# Patient Record
Sex: Female | Born: 1960 | Race: Black or African American | Hispanic: No | Smoking: Never smoker
Health system: Southern US, Community
[De-identification: ages and names within clinical notes are randomized; demographics above are authoritative.]

## PROBLEM LIST (undated history)

## (undated) DIAGNOSIS — E559 Vitamin D deficiency, unspecified: Secondary | ICD-10-CM

## (undated) DIAGNOSIS — D649 Anemia, unspecified: Secondary | ICD-10-CM

## (undated) DIAGNOSIS — K219 Gastro-esophageal reflux disease without esophagitis: Secondary | ICD-10-CM

## (undated) DIAGNOSIS — E119 Type 2 diabetes mellitus without complications: Secondary | ICD-10-CM

## (undated) DIAGNOSIS — I10 Essential (primary) hypertension: Secondary | ICD-10-CM

## (undated) DIAGNOSIS — E785 Hyperlipidemia, unspecified: Secondary | ICD-10-CM

## (undated) HISTORY — DX: Vitamin D deficiency, unspecified: E55.9

## (undated) HISTORY — DX: Gastro-esophageal reflux disease without esophagitis: K21.9

## (undated) HISTORY — DX: Anemia, unspecified: D64.9

## (undated) HISTORY — DX: Hyperlipidemia, unspecified: E78.5

## (undated) HISTORY — DX: Type 2 diabetes mellitus without complications: E11.9

## (undated) HISTORY — DX: Essential (primary) hypertension: I10

---

## 1997-05-12 ENCOUNTER — Other Ambulatory Visit: Admission: RE | Admit: 1997-05-12 | Discharge: 1997-05-12 | Payer: Self-pay | Admitting: *Deleted

## 1998-10-20 ENCOUNTER — Other Ambulatory Visit: Admission: RE | Admit: 1998-10-20 | Discharge: 1998-10-20 | Payer: Self-pay | Admitting: *Deleted

## 1999-01-02 ENCOUNTER — Other Ambulatory Visit: Admission: RE | Admit: 1999-01-02 | Discharge: 1999-01-02 | Payer: Self-pay | Admitting: *Deleted

## 1999-01-25 ENCOUNTER — Encounter (INDEPENDENT_AMBULATORY_CARE_PROVIDER_SITE_OTHER): Payer: Self-pay

## 1999-01-25 ENCOUNTER — Other Ambulatory Visit: Admission: RE | Admit: 1999-01-25 | Discharge: 1999-01-25 | Payer: Self-pay | Admitting: *Deleted

## 1999-10-17 ENCOUNTER — Other Ambulatory Visit: Admission: RE | Admit: 1999-10-17 | Discharge: 1999-10-17 | Payer: Self-pay | Admitting: *Deleted

## 2002-03-19 ENCOUNTER — Other Ambulatory Visit: Admission: RE | Admit: 2002-03-19 | Discharge: 2002-03-19 | Payer: Self-pay | Admitting: *Deleted

## 2003-03-25 ENCOUNTER — Other Ambulatory Visit: Admission: RE | Admit: 2003-03-25 | Discharge: 2003-03-25 | Payer: Self-pay | Admitting: Obstetrics and Gynecology

## 2004-04-06 ENCOUNTER — Other Ambulatory Visit: Admission: RE | Admit: 2004-04-06 | Discharge: 2004-04-06 | Payer: Self-pay | Admitting: Obstetrics and Gynecology

## 2005-05-29 ENCOUNTER — Other Ambulatory Visit: Admission: RE | Admit: 2005-05-29 | Discharge: 2005-05-29 | Payer: Self-pay | Admitting: Obstetrics and Gynecology

## 2007-02-13 HISTORY — PX: HAMMER TOE SURGERY: SHX385

## 2009-08-11 ENCOUNTER — Ambulatory Visit (HOSPITAL_COMMUNITY): Admission: RE | Admit: 2009-08-11 | Discharge: 2009-08-11 | Payer: Self-pay | Admitting: Internal Medicine

## 2010-08-21 ENCOUNTER — Other Ambulatory Visit (HOSPITAL_COMMUNITY): Payer: Self-pay | Admitting: Obstetrics and Gynecology

## 2010-08-21 DIAGNOSIS — Z1231 Encounter for screening mammogram for malignant neoplasm of breast: Secondary | ICD-10-CM

## 2010-08-31 ENCOUNTER — Ambulatory Visit (HOSPITAL_COMMUNITY): Payer: 59

## 2010-09-12 ENCOUNTER — Ambulatory Visit (HOSPITAL_COMMUNITY)
Admission: RE | Admit: 2010-09-12 | Discharge: 2010-09-12 | Disposition: A | Discharge status: Home / Self Care | Payer: 59 | Source: Ambulatory Visit | Attending: Obstetrics and Gynecology | Admitting: Obstetrics and Gynecology

## 2010-09-12 DIAGNOSIS — Z1231 Encounter for screening mammogram for malignant neoplasm of breast: Secondary | ICD-10-CM

## 2011-08-31 ENCOUNTER — Other Ambulatory Visit (HOSPITAL_COMMUNITY): Payer: Self-pay | Admitting: Obstetrics and Gynecology

## 2011-08-31 DIAGNOSIS — Z1231 Encounter for screening mammogram for malignant neoplasm of breast: Secondary | ICD-10-CM

## 2011-09-27 ENCOUNTER — Ambulatory Visit (HOSPITAL_COMMUNITY)
Admission: RE | Admit: 2011-09-27 | Discharge: 2011-09-27 | Disposition: A | Discharge status: Home / Self Care | Payer: No Typology Code available for payment source | Source: Ambulatory Visit | Attending: Obstetrics and Gynecology | Admitting: Obstetrics and Gynecology

## 2011-09-27 DIAGNOSIS — Z1231 Encounter for screening mammogram for malignant neoplasm of breast: Secondary | ICD-10-CM | POA: Insufficient documentation

## 2012-09-10 ENCOUNTER — Other Ambulatory Visit (HOSPITAL_COMMUNITY): Payer: Self-pay | Admitting: Obstetrics and Gynecology

## 2012-09-10 DIAGNOSIS — Z1231 Encounter for screening mammogram for malignant neoplasm of breast: Secondary | ICD-10-CM

## 2012-10-02 ENCOUNTER — Other Ambulatory Visit (HOSPITAL_COMMUNITY): Payer: Self-pay | Admitting: Obstetrics and Gynecology

## 2012-10-02 ENCOUNTER — Ambulatory Visit (HOSPITAL_COMMUNITY)
Admission: RE | Admit: 2012-10-02 | Discharge: 2012-10-02 | Disposition: A | Discharge status: Home / Self Care | Payer: PRIVATE HEALTH INSURANCE | Source: Ambulatory Visit | Attending: Obstetrics and Gynecology | Admitting: Obstetrics and Gynecology

## 2012-10-02 DIAGNOSIS — Z1231 Encounter for screening mammogram for malignant neoplasm of breast: Secondary | ICD-10-CM | POA: Insufficient documentation

## 2013-01-02 ENCOUNTER — Ambulatory Visit: Payer: Self-pay | Admitting: Emergency Medicine

## 2013-01-10 ENCOUNTER — Encounter: Payer: Self-pay | Admitting: Internal Medicine

## 2013-01-10 DIAGNOSIS — I10 Essential (primary) hypertension: Secondary | ICD-10-CM | POA: Insufficient documentation

## 2013-01-10 DIAGNOSIS — E785 Hyperlipidemia, unspecified: Secondary | ICD-10-CM | POA: Insufficient documentation

## 2013-01-10 DIAGNOSIS — D649 Anemia, unspecified: Secondary | ICD-10-CM | POA: Insufficient documentation

## 2013-01-10 DIAGNOSIS — E559 Vitamin D deficiency, unspecified: Secondary | ICD-10-CM | POA: Insufficient documentation

## 2013-01-10 DIAGNOSIS — K219 Gastro-esophageal reflux disease without esophagitis: Secondary | ICD-10-CM | POA: Insufficient documentation

## 2013-01-12 ENCOUNTER — Ambulatory Visit: Payer: Self-pay | Admitting: Emergency Medicine

## 2013-04-02 ENCOUNTER — Other Ambulatory Visit: Payer: Self-pay | Admitting: Internal Medicine

## 2013-04-04 DIAGNOSIS — Z79899 Other long term (current) drug therapy: Secondary | ICD-10-CM | POA: Insufficient documentation

## 2013-04-04 NOTE — Patient Instructions (Signed)

## 2013-04-04 NOTE — Progress Notes (Signed)
Patient ID: Gloria Hudson, female   DOB: 16-Jun-1960, 53 y.o.   MRN: [id number]   Annual Screening Comprehensive Examination  This very nice 53 y.o. SBF presents for complete physical.  Patient has been followed for HTN, Diabetes  Prediabetes, Hyperlipidemia, GERD and Vitamin D Deficiency. She also relate a 3 day Hx/o head and chest congestion, frontal HA, pain over her cheeks and cough.    HTN predates since 2004 . Patient's BP has been controlled at home. Today's BP: 138/82 mmHg. Patient denies any cardiac symptoms as chest pain, palpitations, shortness of breath, dizziness or ankle swelling.   Patient's hyperlipidemia is controlled with diet and medications. Patient denies myalgias or other medication SE's. Last cholesterol last visit was 183, triglycerides 111, HDL 87 and LDL 74 in Sept 4098 - all at goal..     Patient has prediabetes/insulin resistance predating since Feb 2011 with  A1c  6.2% / insulin 97 and last A1c/Insulin was 6.0%/80 in Sept 2014 showing Insulin Resistance. She is overweight with a BMI 27 and has lost about 5 # since Sept 2014 with the help of Phentermine.  Patient denies reactive hypoglycemic symptoms, visual blurring, diabetic polys, or paresthesias.    Patient has GERD which is controlled with diet and meds.   Finally, patient has history of Vitamin D Deficiency of 8 in 2008 with last vitamin D 67 in Sept 2014.   Medication List         aspirin 81 MG tablet  Take 81 mg by mouth daily.     atorvastatin 40 MG tablet  Commonly known as:  LIPITOR  Take 40 mg by mouth every other day.     azithromycin 250 MG tablet  Commonly known as:  ZITHROMAX  Take 2 tablets (500 mg) on  Day 1,  followed by 1 tablet (250 mg) once daily on Days 2 through 5.     cyclobenzaprine 10 MG tablet  Commonly known as:  FLEXERIL  Take 1/2 to 1 tablet by mouth three times daily as needed for spasm     hydrochlorothiazide 25 MG tablet  Commonly known as:  HYDRODIURIL  Take one  tablet by mouth every morning     HYDROcodone-acetaminophen 5-325 MG per tablet  Commonly known as:  NORCO  1/2 to 1 tablet every 3 - 4 hours as needed for cough or pain     Magnesium 250 MG Tabs  Take by mouth daily.     meloxicam 15 MG tablet  Commonly known as:  MOBIC     multivitamin with minerals tablet  Take 1 tablet by mouth daily.     omeprazole 40 MG capsule  Commonly known as:  PRILOSEC  Take 40 mg by mouth daily.     phentermine 37.5 MG capsule  Take 37.5 mg by mouth daily.     predniSONE 20 MG tablet  Commonly known as:  DELTASONE  Take 1 tablet (20 mg total) by mouth See admin instructions. 1 tab 3 x day for 3 days, then 1 tab 2 x day for 3 days, then 1 tab 1 x day for 5 days     SLOW FE PO  Take by mouth daily.     SUPER B COMPLEX PO  Take by mouth daily.     VELIVET 0.1/0.125/0.15 -0.025 MG tablet  Generic drug:  desogestrel-ethinyl estradiol     Vitamin D 2000 UNITS tablet  Take 2,000 Units by mouth 4 (four) times daily.  Allergies no known allergies  Past Medical History  Diagnosis Date  . Hypertension   . Hyperlipidemia   . GERD (gastroesophageal reflux disease)   . Vitamin D deficiency   . Anemia     Past Surgical History  2009 - Lt 3-4-5 Hammertoe Repair  Family History  Problem Relation Age of Onset  . Hypertension Mother   . Diabetes Maternal Grandmother     History  Substance Use Topics  . Smoking status: Never Smoker   . Smokeless tobacco: Not on file  . Alcohol Use: Yes     Comment: rare    ROS Constitutional: Denies fever, chills, weight loss/gain, headaches, insomnia, fatigue, night sweats, and change in appetite. Eyes: Denies redness, blurred vision, diplopia, discharge, itchy, watery eyes.  ENT: Denies discharge, post nasal drip, epistaxis, sore throat, earache, hearing loss, dental pain, Tinnitus, Vertigo, snoring.  C/o sinus pain & congestion x 3 days. Cardio: Denies chest pain, palpitations, irregular  heartbeat, syncope, dyspnea, diaphoresis, orthopnea, PND, claudication, edema Respiratory: denies dyspnea, DOE, pleurisy, hoarseness, laryngitis, wheezing. Has recent dry cough. Gastrointestinal: Denies dysphagia, heartburn, reflux, water brash, pain, cramps, nausea, vomiting, bloating, diarrhea, constipation, hematemesis, melena, hematochezia, jaundice, hemorrhoids Genitourinary: Denies dysuria, frequency, urgency, nocturia, hesitancy, discharge, hematuria, flank pain. Had Recent PAP by Dr Jarold Song. Breast:Breast lumps, nipple discharge, bleeding.  Musculoskeletal: Denies arthralgia, myalgia, stiffness, Jt. Swelling, pain, limp, and strain/sprain. Skin: Denies puritis, rash, hives, warts, acne, eczema, changing in skin lesion Neuro: No weakness, tremor, incoordination, spasms, paresthesia, pain Psychiatric: Denies confusion, memory loss, sensory loss Endocrine: Denies change in weight, skin, hair change, nocturia, and paresthesia, diabetic polys, visual blurring, hyper / hypo glycemic episodes.  Heme/Lymph: No excessive bleeding, bruising, enlarged lymph nodes.  BP: 138/82  Pulse: 84  Temp: 97.9 F (36.6 C)  Resp: 18   Estimated body mass index is 27.39 kg/(m^2) as calculated from the following:   Height as of this encounter: 5' 5.5" (1.664 m).   Weight as of this encounter: 167 lb 3.2 oz (75.841 kg).  Physical Exam General Appearance: Well nourished, in no apparent distress. Eyes: PERRLA, EOMs, conjunctiva no swelling or erythema, normal fundi and vessels. Sinuses: Bilat. frontal/maxillary tenderness ENT/Mouth: EACs patent / TMs  nl. Nares clear without erythema, swelling, mucoid exudates. Oral hygiene is good. No erythema, swelling, or exudate. Tongue normal, non-obstructing. Tonsils not swollen or erythematous. Hearing normal.  Neck: Supple, thyroid normal. No bruits, nodes or JVD. Respiratory: Respiratory effort normal.  BS equal and clear bilateral without rales, rhonci, wheezing  or stridor. Has dry cough. Cardio: Heart sounds are normal with regular rate and rhythm and no murmurs, rubs or gallops. Peripheral pulses are normal and equal bilaterally without edema. No aortic or femoral bruits. Chest: symmetric with normal excursions and percussion. Breasts: Deferred to Dr Meissinger. Abdomen: Flat, soft, with bowl sounds. Nontender, no guarding, rebound, hernias, masses, or organomegaly.  Lymphatics: Non tender without lymphadenopathy.  Musculoskeletal: Full ROM all peripheral extremities, joint stability, 5/5 strength, and normal gait. Skin: Warm and dry without rashes, lesions, cyanosis, clubbing or  ecchymosis.  Neuro: Cranial nerves intact, reflexes equal bilaterally. Normal muscle tone, no cerebellar symptoms. Sensation intact.  Pysch: Awake and oriented X 3, normal affect, Insight and Judgment appropriate.   Assessment and Plan  1. Annual Screening Examination 2. Hypertension  3. Hyperlipidemia 4. Pre Diabetes 5. Vitamin D Deficiency 6. GERD 7.Sinusitis, Acute  Continue prudent diet as discussed, weight control, BP monitoring, regular exercise, and medications. Discussed med's effects and SE's. Screening  labs and tests as requested with regular follow-up as recommended. Will schedule Screening Colon as she is now 53 y.o.

## 2013-04-06 ENCOUNTER — Encounter: Payer: Self-pay | Admitting: Internal Medicine

## 2013-04-06 ENCOUNTER — Ambulatory Visit (INDEPENDENT_AMBULATORY_CARE_PROVIDER_SITE_OTHER): Payer: PRIVATE HEALTH INSURANCE | Admitting: Internal Medicine

## 2013-04-06 VITALS — BP 138/82 | HR 84 | Temp 97.9°F | Resp 18 | Ht 65.5 in | Wt 167.2 lb

## 2013-04-06 DIAGNOSIS — Z0000 Encounter for general adult medical examination without abnormal findings: Secondary | ICD-10-CM

## 2013-04-06 DIAGNOSIS — E559 Vitamin D deficiency, unspecified: Secondary | ICD-10-CM

## 2013-04-06 DIAGNOSIS — R7402 Elevation of levels of lactic acid dehydrogenase (LDH): Secondary | ICD-10-CM

## 2013-04-06 DIAGNOSIS — Z113 Encounter for screening for infections with a predominantly sexual mode of transmission: Secondary | ICD-10-CM

## 2013-04-06 DIAGNOSIS — I10 Essential (primary) hypertension: Secondary | ICD-10-CM

## 2013-04-06 DIAGNOSIS — R7309 Other abnormal glucose: Secondary | ICD-10-CM

## 2013-04-06 DIAGNOSIS — Z1211 Encounter for screening for malignant neoplasm of colon: Secondary | ICD-10-CM

## 2013-04-06 DIAGNOSIS — Z79899 Other long term (current) drug therapy: Secondary | ICD-10-CM

## 2013-04-06 DIAGNOSIS — R740 Nonspecific elevation of levels of transaminase and lactic acid dehydrogenase [LDH]: Secondary | ICD-10-CM

## 2013-04-06 DIAGNOSIS — Z1212 Encounter for screening for malignant neoplasm of rectum: Secondary | ICD-10-CM

## 2013-04-06 DIAGNOSIS — J019 Acute sinusitis, unspecified: Secondary | ICD-10-CM

## 2013-04-06 LAB — CBC WITH DIFFERENTIAL/PLATELET
BASOS ABS: 0.1 10*3/uL (ref 0.0–0.1)
Basophils Relative: 1 % (ref 0–1)
EOS ABS: 0.1 10*3/uL (ref 0.0–0.7)
Eosinophils Relative: 1 % (ref 0–5)
HCT: 36.3 % (ref 36.0–46.0)
HEMOGLOBIN: 12.4 g/dL (ref 12.0–15.0)
LYMPHS PCT: 28 % (ref 12–46)
Lymphs Abs: 2.4 10*3/uL (ref 0.7–4.0)
MCH: 27.9 pg (ref 26.0–34.0)
MCHC: 34.2 g/dL (ref 30.0–36.0)
MCV: 81.6 fL (ref 78.0–100.0)
Monocytes Absolute: 0.9 10*3/uL (ref 0.1–1.0)
Monocytes Relative: 11 % (ref 3–12)
Neutro Abs: 5 10*3/uL (ref 1.7–7.7)
Neutrophils Relative %: 59 % (ref 43–77)
Platelets: 337 10*3/uL (ref 150–400)
RBC: 4.45 MIL/uL (ref 3.87–5.11)
RDW: 14.5 % (ref 11.5–15.5)
WBC: 8.4 10*3/uL (ref 4.0–10.5)

## 2013-04-06 LAB — HEMOGLOBIN A1C
HEMOGLOBIN A1C: 6 % — AB (ref <5.7)
MEAN PLASMA GLUCOSE: 126 mg/dL — AB (ref <117)

## 2013-04-06 MED ORDER — PREDNISONE 20 MG PO TABS: 20.0000 mg | ORAL_TABLET | ORAL | Status: DC

## 2013-04-06 MED ORDER — HYDROCODONE-ACETAMINOPHEN 5-325 MG PO TABS: ORAL_TABLET | ORAL | Status: DC

## 2013-04-06 MED ORDER — AZITHROMYCIN 250 MG PO TABS: ORAL_TABLET | ORAL | Status: AC

## 2013-04-07 LAB — URINALYSIS, MICROSCOPIC ONLY
Casts: NONE SEEN
Crystals: NONE SEEN

## 2013-04-07 LAB — BASIC METABOLIC PANEL WITH GFR
BUN: 11 mg/dL (ref 6–23)
CO2: 22 mEq/L (ref 19–32)
CREATININE: 0.76 mg/dL (ref 0.50–1.10)
Calcium: 9.9 mg/dL (ref 8.4–10.5)
Chloride: 100 mEq/L (ref 96–112)
GFR, Est African American: >89 mL/min
GFR, Est Non African American: >89 mL/min
Glucose, Bld: 72 mg/dL (ref 70–99)
POTASSIUM: 3.9 meq/L (ref 3.5–5.3)
Sodium: 136 mEq/L (ref 135–145)

## 2013-04-07 LAB — HEPATIC FUNCTION PANEL
ALBUMIN: 4.2 g/dL (ref 3.5–5.2)
ALK PHOS: 76 U/L (ref 39–117)
ALT: 13 U/L (ref 0–35)
AST: 19 U/L (ref 0–37)
Bilirubin, Direct: 0.1 mg/dL (ref 0.0–0.3)
Indirect Bilirubin: 0.4 mg/dL (ref 0.2–1.2)
TOTAL PROTEIN: 7.8 g/dL (ref 6.0–8.3)
Total Bilirubin: 0.5 mg/dL (ref 0.2–1.2)

## 2013-04-07 LAB — MICROALBUMIN / CREATININE URINE RATIO
Creatinine, Urine: 158.5 mg/dL
Microalb Creat Ratio: 7.9 mg/g (ref 0.0–30.0)
Microalb, Ur: 1.26 mg/dL (ref 0.00–1.89)

## 2013-04-07 LAB — HIV ANTIBODY (ROUTINE TESTING W REFLEX): HIV: NONREACTIVE

## 2013-04-07 LAB — LIPID PANEL
CHOLESTEROL: 203 mg/dL — AB (ref 0–200)
HDL: 110 mg/dL (ref >39)
LDL CALC: 74 mg/dL (ref 0–99)
Total CHOL/HDL Ratio: 1.8 Ratio
Triglycerides: 97 mg/dL (ref <150)
VLDL: 19 mg/dL (ref 0–40)

## 2013-04-07 LAB — HEPATITIS B CORE ANTIBODY, TOTAL: HEP B C TOTAL AB: NONREACTIVE

## 2013-04-07 LAB — TSH: TSH: 1.753 u[IU]/mL (ref 0.350–4.500)

## 2013-04-07 LAB — INSULIN, FASTING: Insulin fasting, serum: 12 u[IU]/mL (ref 3–28)

## 2013-04-07 LAB — HEPATITIS A ANTIBODY, TOTAL: HEP A TOTAL AB: NONREACTIVE

## 2013-04-07 LAB — RPR

## 2013-04-07 LAB — VITAMIN B12: VITAMIN B 12: 752 pg/mL (ref 211–911)

## 2013-04-07 LAB — HEPATITIS B SURFACE ANTIBODY,QUALITATIVE: Hep B S Ab: POSITIVE — AB

## 2013-04-07 LAB — MAGNESIUM: Magnesium: 2 mg/dL (ref 1.5–2.5)

## 2013-04-07 LAB — HEPATITIS C ANTIBODY: HCV Ab: NEGATIVE

## 2013-04-07 LAB — VITAMIN D 25 HYDROXY (VIT D DEFICIENCY, FRACTURES): Vit D, 25-Hydroxy: 71 ng/mL (ref 30–89)

## 2013-04-08 ENCOUNTER — Encounter: Payer: Self-pay | Admitting: Gastroenterology

## 2013-04-10 LAB — HEPATITIS B E ANTIBODY: Hepatitis Be Antibody: NEGATIVE

## 2013-04-17 ENCOUNTER — Other Ambulatory Visit (INDEPENDENT_AMBULATORY_CARE_PROVIDER_SITE_OTHER): Payer: PRIVATE HEALTH INSURANCE

## 2013-04-17 DIAGNOSIS — Z1212 Encounter for screening for malignant neoplasm of rectum: Secondary | ICD-10-CM

## 2013-04-17 LAB — POC HEMOCCULT BLD/STL (HOME/3-CARD/SCREEN)
Card #3 Fecal Occult Blood, POC: NEGATIVE
FECAL OCCULT BLD: NEGATIVE
Fecal Occult Blood, POC: NEGATIVE

## 2013-04-28 ENCOUNTER — Other Ambulatory Visit: Payer: Self-pay | Admitting: Internal Medicine

## 2013-05-04 ENCOUNTER — Encounter: Payer: No Typology Code available for payment source | Admitting: Gastroenterology

## 2013-05-29 ENCOUNTER — Ambulatory Visit (AMBULATORY_SURGERY_CENTER): Payer: Self-pay | Admitting: *Deleted

## 2013-05-29 VITALS — Ht 64.5 in | Wt 176.0 lb

## 2013-05-29 DIAGNOSIS — Z1211 Encounter for screening for malignant neoplasm of colon: Secondary | ICD-10-CM

## 2013-05-29 MED ORDER — MOVIPREP 100 G PO SOLR: ORAL | Status: DC

## 2013-05-29 NOTE — Progress Notes (Signed)
No allergies to eggs or soy. No problems with anesthesia.  Pt given Emmi instructions for colonoscopy  No oxygen use  PT INSTRUCTED TO STOP PHENTERMINE 10 DAYS BEFORE PROCEDURE

## 2013-06-10 ENCOUNTER — Encounter: Payer: Self-pay | Admitting: Gastroenterology

## 2013-06-15 ENCOUNTER — Encounter: Payer: Self-pay | Admitting: Gastroenterology

## 2013-06-15 ENCOUNTER — Telehealth: Payer: Self-pay | Admitting: *Deleted

## 2013-06-15 ENCOUNTER — Ambulatory Visit (AMBULATORY_SURGERY_CENTER): Payer: PRIVATE HEALTH INSURANCE | Admitting: Gastroenterology

## 2013-06-15 VITALS — BP 126/75 | HR 88 | Temp 97.4°F | Resp 18 | Ht 64.5 in | Wt 176.0 lb

## 2013-06-15 DIAGNOSIS — Z1211 Encounter for screening for malignant neoplasm of colon: Secondary | ICD-10-CM

## 2013-06-15 MED ORDER — SODIUM CHLORIDE 0.9 % IV SOLN: 500.0000 mL | INTRAVENOUS | Status: DC

## 2013-06-15 NOTE — Op Note (Signed)
[facility] [address] [place], [id number]   COLONOSCOPY PROCEDURE REPORT  PATIENT: Gloria CarrowLindsay, [name]  MR#: [id number] BIRTHDATE: Mar 14, 1960 , 52  yrs. old GENDER: Female ENDOSCOPIST: Rachael Feeaniel P Mercer Stallworth, MD REFERRED AO:[id number]:William Oneta RackMcKeown, M.D. PROCEDURE DATE:  06/15/2013 PROCEDURE:   Colonoscopy, screening First Screening Colonoscopy - Avg.  risk and is 50 yrs.  old or older Yes.  Prior Negative Screening - Now for repeat screening. N/A  History of Adenoma - Now for follow-up colonoscopy & has been > or = to 3 yrs.  N/A  Polyps Removed Today? No.  Recommend repeat exam, <10 yrs? No. ASA CLASS:   Class II INDICATIONS:average risk screening. MEDICATIONS: MAC sedation, administered by CRNA and propofol (Diprivan) 250mg  IV  DESCRIPTION OF PROCEDURE:   After the risks benefits and alternatives of the procedure were thoroughly explained, informed consent was obtained.  A digital rectal exam revealed no abnormalities of the rectum.   The LB [id number]  endoscope was introduced through the anus and advanced to the cecum, which was identified by both the appendix and ileocecal valve. No adverse events experienced.   The quality of the prep was excellent.  The instrument was then slowly withdrawn as the colon was fully examined.   COLON FINDINGS: A normal appearing cecum, ileocecal valve, and appendiceal orifice were identified.  The ascending, hepatic flexure, transverse, splenic flexure, descending, sigmoid colon and rectum appeared unremarkable.  No polyps or cancers were seen. Retroflexed views revealed no abnormalities. The time to cecum=4 minutes 28 seconds.  Withdrawal time=6 minutes 06 seconds.  The scope was withdrawn and the procedure completed. COMPLICATIONS: There were no complications.  ENDOSCOPIC IMPRESSION: Normal colon No polyps or cancers  RECOMMENDATIONS: You should continue to follow colorectal cancer screening guidelines for "routine  risk" patients with a repeat colonoscopy in 10 years.   eSigned:  Rachael Feeaniel P Neev Mcmains, MD 06/15/2013 1:48 PM

## 2013-06-15 NOTE — Telephone Encounter (Signed)
Patient called and states she has ankle and leg edema for a week and worse over the weekend.  Patient is starting the prep for her colonoscopy this AM.  Per Dr Oneta RackMcKeown, go ahead with colonoscopy, restart HCTZ after colonoscopy and call for OV if edema continues.  Patient aware.

## 2013-06-15 NOTE — Patient Instructions (Signed)
YOU HAD AN ENDOSCOPIC PROCEDURE TODAY AT THE [facility]: Refer to the procedure report that was given to you for any specific questions about what was found during the examination.  If the procedure report does not answer your questions, please call your gastroenterologist to clarify.  If you requested that your care partner not be given the details of your procedure findings, then the procedure report has been included in a sealed envelope for you to review at your convenience later.  YOU SHOULD EXPECT: Some feelings of bloating in the abdomen. Passage of more gas than usual.  Walking can help get rid of the air that was put into your GI tract during the procedure and reduce the bloating. If you had a lower endoscopy (such as a colonoscopy or flexible sigmoidoscopy) you may notice spotting of blood in your stool or on the toilet paper. If you underwent a bowel prep for your procedure, then you may not have a normal bowel movement for a few days.  DIET: Your first meal following the procedure should be a light meal and then it is ok to progress to your normal diet.  A half-sandwich or bowl of soup is an example of a good first meal.  Heavy or fried foods are harder to digest and may make you feel nauseous or bloated.  Likewise meals heavy in dairy and vegetables can cause extra gas to form and this can also increase the bloating.  Drink plenty of fluids but you should avoid alcoholic beverages for 24 hours.  ACTIVITY: Your care partner should take you home directly after the procedure.  You should plan to take it easy, moving slowly for the rest of the day.  You can resume normal activity the day after the procedure however you should NOT DRIVE or use heavy machinery for 24 hours (because of the sedation medicines used during the test).    SYMPTOMS TO REPORT IMMEDIATELY: A gastroenterologist can be reached at any hour.  During normal business hours, 8:30 AM to 5:00 PM Monday through Friday,  call [phone].  After hours and on weekends, please call the GI answering service at [phone] who will take a message and have the physician on call contact you.   Following lower endoscopy (colonoscopy or flexible sigmoidoscopy):  Excessive amounts of blood in the stool  Significant tenderness or worsening of abdominal pains  Swelling of the abdomen that is new, acute  Fever of 100F or higher    FOLLOW UP: If any biopsies were taken you will be contacted by phone or by letter within the next 1-3 weeks.  Call your gastroenterologist if you have not heard about the biopsies in 3 weeks.  Our staff will call the home number listed on your records the next business day following your procedure to check on you and address any questions or concerns that you may have at that time regarding the information given to you following your procedure. This is a courtesy call and so if there is no answer at the home number and we have not heard from you through the emergency physician on call, we will assume that you have returned to your regular daily activities without incident.  SIGNATURES/CONFIDENTIALITY: You and/or your care partner have signed paperwork which will be entered into your electronic medical record.  These signatures attest to the fact that that the information above on your After Visit Summary has been reviewed and is understood.  Full responsibility of the confidentiality   of this discharge information lies with you and/or your care-partner.     

## 2013-06-15 NOTE — Progress Notes (Signed)
Procedure ends, to recovery, report given and VSS. 

## 2013-06-16 ENCOUNTER — Telehealth: Payer: Self-pay | Admitting: *Deleted

## 2013-06-16 NOTE — Telephone Encounter (Signed)
  Follow up Call-  Call back number 06/15/2013  Post procedure Call Back phone  # [phone]  Permission to leave phone message Yes     Patient questions:  Do you have a fever, pain , or abdominal swelling? no Pain Score  0 *  Have you tolerated food without any problems? yes  Have you been able to return to your normal activities? yes  Do you have any questions about your discharge instructions: Diet   no Medications  no Follow up visit  no  Do you have questions or concerns about your Care? no  Actions: * If pain score is 4 or above: No action needed, pain <4.

## 2013-07-13 ENCOUNTER — Ambulatory Visit: Payer: Self-pay | Admitting: Physician Assistant

## 2013-09-04 ENCOUNTER — Other Ambulatory Visit (HOSPITAL_COMMUNITY): Payer: Self-pay | Admitting: Family Medicine

## 2013-09-04 DIAGNOSIS — Z1231 Encounter for screening mammogram for malignant neoplasm of breast: Secondary | ICD-10-CM

## 2013-10-06 ENCOUNTER — Ambulatory Visit (HOSPITAL_COMMUNITY)
Admission: RE | Admit: 2013-10-06 | Discharge: 2013-10-06 | Disposition: A | Discharge status: Home / Self Care | Payer: PRIVATE HEALTH INSURANCE | Source: Ambulatory Visit | Attending: Family Medicine | Admitting: Family Medicine

## 2013-10-06 DIAGNOSIS — Z1231 Encounter for screening mammogram for malignant neoplasm of breast: Secondary | ICD-10-CM | POA: Insufficient documentation

## 2013-10-13 ENCOUNTER — Ambulatory Visit: Payer: Self-pay | Admitting: Internal Medicine

## 2014-04-06 ENCOUNTER — Encounter: Payer: Self-pay | Admitting: Internal Medicine

## 2015-03-14 DIAGNOSIS — M1990 Unspecified osteoarthritis, unspecified site: Secondary | ICD-10-CM | POA: Insufficient documentation

## 2015-03-14 DIAGNOSIS — D509 Iron deficiency anemia, unspecified: Secondary | ICD-10-CM | POA: Insufficient documentation

## 2015-03-14 DIAGNOSIS — M898X9 Other specified disorders of bone, unspecified site: Secondary | ICD-10-CM | POA: Insufficient documentation

## 2015-03-24 DIAGNOSIS — R52 Pain, unspecified: Secondary | ICD-10-CM | POA: Insufficient documentation

## 2015-04-29 DIAGNOSIS — J45909 Unspecified asthma, uncomplicated: Secondary | ICD-10-CM | POA: Insufficient documentation

## 2015-04-29 DIAGNOSIS — R3129 Other microscopic hematuria: Secondary | ICD-10-CM | POA: Insufficient documentation

## 2015-06-15 ENCOUNTER — Other Ambulatory Visit: Payer: Self-pay | Admitting: Obstetrics and Gynecology

## 2015-06-15 DIAGNOSIS — R928 Other abnormal and inconclusive findings on diagnostic imaging of breast: Secondary | ICD-10-CM

## 2015-06-27 ENCOUNTER — Other Ambulatory Visit: Payer: PRIVATE HEALTH INSURANCE

## 2015-07-01 ENCOUNTER — Ambulatory Visit
Admission: RE | Admit: 2015-07-01 | Discharge: 2015-07-01 | Disposition: A | Discharge status: Home / Self Care | Payer: PRIVATE HEALTH INSURANCE | Source: Ambulatory Visit | Attending: Obstetrics and Gynecology | Admitting: Obstetrics and Gynecology

## 2015-07-01 DIAGNOSIS — R928 Other abnormal and inconclusive findings on diagnostic imaging of breast: Secondary | ICD-10-CM

## 2016-06-01 DIAGNOSIS — M2010 Hallux valgus (acquired), unspecified foot: Secondary | ICD-10-CM | POA: Insufficient documentation

## 2016-06-01 DIAGNOSIS — E669 Obesity, unspecified: Secondary | ICD-10-CM | POA: Insufficient documentation

## 2017-03-01 ENCOUNTER — Other Ambulatory Visit: Payer: Self-pay | Admitting: Obstetrics and Gynecology

## 2017-03-01 DIAGNOSIS — Z1231 Encounter for screening mammogram for malignant neoplasm of breast: Secondary | ICD-10-CM

## 2017-03-22 ENCOUNTER — Ambulatory Visit
Admission: RE | Admit: 2017-03-22 | Discharge: 2017-03-22 | Disposition: A | Discharge status: Home / Self Care | Payer: PRIVATE HEALTH INSURANCE | Source: Ambulatory Visit | Attending: Obstetrics and Gynecology | Admitting: Obstetrics and Gynecology

## 2017-03-22 DIAGNOSIS — Z1231 Encounter for screening mammogram for malignant neoplasm of breast: Secondary | ICD-10-CM

## 2017-03-25 ENCOUNTER — Other Ambulatory Visit: Payer: Self-pay | Admitting: Obstetrics and Gynecology

## 2017-03-25 DIAGNOSIS — R928 Other abnormal and inconclusive findings on diagnostic imaging of breast: Secondary | ICD-10-CM

## 2017-03-29 ENCOUNTER — Ambulatory Visit
Admission: RE | Admit: 2017-03-29 | Discharge: 2017-03-29 | Disposition: A | Discharge status: Home / Self Care | Payer: PRIVATE HEALTH INSURANCE | Source: Ambulatory Visit | Attending: Obstetrics and Gynecology | Admitting: Obstetrics and Gynecology

## 2017-03-29 ENCOUNTER — Other Ambulatory Visit: Payer: Self-pay | Admitting: Obstetrics and Gynecology

## 2017-03-29 DIAGNOSIS — R921 Mammographic calcification found on diagnostic imaging of breast: Secondary | ICD-10-CM

## 2017-03-29 DIAGNOSIS — R928 Other abnormal and inconclusive findings on diagnostic imaging of breast: Secondary | ICD-10-CM

## 2017-04-03 ENCOUNTER — Ambulatory Visit
Admission: RE | Admit: 2017-04-03 | Discharge: 2017-04-03 | Disposition: A | Discharge status: Home / Self Care | Payer: PRIVATE HEALTH INSURANCE | Source: Ambulatory Visit | Attending: Obstetrics and Gynecology | Admitting: Obstetrics and Gynecology

## 2017-04-03 DIAGNOSIS — R921 Mammographic calcification found on diagnostic imaging of breast: Secondary | ICD-10-CM

## 2017-04-03 HISTORY — PX: BREAST BIOPSY: SHX20

## 2017-06-07 DIAGNOSIS — J329 Chronic sinusitis, unspecified: Secondary | ICD-10-CM | POA: Insufficient documentation

## 2017-08-30 DIAGNOSIS — I872 Venous insufficiency (chronic) (peripheral): Secondary | ICD-10-CM | POA: Insufficient documentation

## 2017-12-06 DIAGNOSIS — R079 Chest pain, unspecified: Secondary | ICD-10-CM | POA: Insufficient documentation

## 2018-03-03 ENCOUNTER — Other Ambulatory Visit: Payer: Self-pay | Admitting: Obstetrics and Gynecology

## 2018-03-03 DIAGNOSIS — Z1231 Encounter for screening mammogram for malignant neoplasm of breast: Secondary | ICD-10-CM

## 2018-03-08 ENCOUNTER — Other Ambulatory Visit: Payer: Self-pay | Admitting: Cardiology

## 2018-03-08 DIAGNOSIS — R0789 Other chest pain: Secondary | ICD-10-CM

## 2018-03-08 DIAGNOSIS — R0602 Shortness of breath: Secondary | ICD-10-CM

## 2018-03-08 DIAGNOSIS — I10 Essential (primary) hypertension: Secondary | ICD-10-CM

## 2018-03-25 ENCOUNTER — Other Ambulatory Visit: Payer: Self-pay | Admitting: Cardiology

## 2018-03-25 DIAGNOSIS — R0602 Shortness of breath: Secondary | ICD-10-CM

## 2018-03-25 DIAGNOSIS — R0789 Other chest pain: Secondary | ICD-10-CM

## 2018-03-25 DIAGNOSIS — I10 Essential (primary) hypertension: Secondary | ICD-10-CM

## 2018-04-02 ENCOUNTER — Ambulatory Visit: Payer: PRIVATE HEALTH INSURANCE

## 2018-04-02 DIAGNOSIS — I10 Essential (primary) hypertension: Secondary | ICD-10-CM

## 2018-04-02 DIAGNOSIS — R0602 Shortness of breath: Secondary | ICD-10-CM | POA: Diagnosis not present

## 2018-04-02 DIAGNOSIS — R0789 Other chest pain: Secondary | ICD-10-CM

## 2018-04-04 ENCOUNTER — Ambulatory Visit: Payer: PRIVATE HEALTH INSURANCE

## 2018-05-05 ENCOUNTER — Ambulatory Visit: Payer: PRIVATE HEALTH INSURANCE

## 2018-06-16 ENCOUNTER — Ambulatory Visit: Payer: PRIVATE HEALTH INSURANCE

## 2018-07-29 ENCOUNTER — Ambulatory Visit
Admission: RE | Admit: 2018-07-29 | Discharge: 2018-07-29 | Disposition: A | Discharge status: Home / Self Care | Payer: PRIVATE HEALTH INSURANCE | Source: Ambulatory Visit | Attending: Obstetrics and Gynecology | Admitting: Obstetrics and Gynecology

## 2018-07-29 ENCOUNTER — Other Ambulatory Visit: Payer: Self-pay

## 2018-07-29 DIAGNOSIS — Z1231 Encounter for screening mammogram for malignant neoplasm of breast: Secondary | ICD-10-CM

## 2018-08-04 ENCOUNTER — Ambulatory Visit: Payer: PRIVATE HEALTH INSURANCE

## 2019-07-10 ENCOUNTER — Other Ambulatory Visit: Payer: Self-pay | Admitting: Obstetrics and Gynecology

## 2019-07-10 DIAGNOSIS — Z1231 Encounter for screening mammogram for malignant neoplasm of breast: Secondary | ICD-10-CM

## 2019-07-31 ENCOUNTER — Ambulatory Visit: Payer: PRIVATE HEALTH INSURANCE

## 2019-08-14 ENCOUNTER — Other Ambulatory Visit: Payer: Self-pay

## 2019-08-14 ENCOUNTER — Ambulatory Visit
Admission: RE | Admit: 2019-08-14 | Discharge: 2019-08-14 | Disposition: A | Discharge status: Home / Self Care | Payer: PRIVATE HEALTH INSURANCE | Source: Ambulatory Visit | Attending: Obstetrics and Gynecology | Admitting: Obstetrics and Gynecology

## 2019-08-14 DIAGNOSIS — Z1231 Encounter for screening mammogram for malignant neoplasm of breast: Secondary | ICD-10-CM

## 2019-12-12 ENCOUNTER — Ambulatory Visit: Payer: PRIVATE HEALTH INSURANCE | Attending: Internal Medicine

## 2019-12-12 DIAGNOSIS — Z23 Encounter for immunization: Secondary | ICD-10-CM

## 2019-12-12 NOTE — Progress Notes (Signed)
   Covid-19 Vaccination Clinic  Name:  Gloria Hudson    MRN: [id number] DOB: Dec 27, 1960  12/12/2019  Gloria Hudson was observed post Covid-19 immunization for 15 minutes without incident. She was provided with Vaccine Information Sheet and instruction to access the V-Safe system.   Gloria Hudson was instructed to call 911 with any severe reactions post vaccine: Marland Kitchen Difficulty breathing  . Swelling of face and throat  . A fast heartbeat  . A bad rash all over body  . Dizziness and weakness

## 2020-04-07 ENCOUNTER — Ambulatory Visit: Payer: PRIVATE HEALTH INSURANCE | Admitting: Podiatry

## 2020-04-07 ENCOUNTER — Ambulatory Visit (INDEPENDENT_AMBULATORY_CARE_PROVIDER_SITE_OTHER): Payer: PRIVATE HEALTH INSURANCE

## 2020-04-07 ENCOUNTER — Encounter: Payer: Self-pay | Admitting: Podiatry

## 2020-04-07 ENCOUNTER — Other Ambulatory Visit: Payer: Self-pay

## 2020-04-07 DIAGNOSIS — R319 Hematuria, unspecified: Secondary | ICD-10-CM | POA: Insufficient documentation

## 2020-04-07 DIAGNOSIS — M722 Plantar fascial fibromatosis: Secondary | ICD-10-CM

## 2020-04-07 MED ORDER — MELOXICAM 15 MG PO TABS: 15.0000 mg | ORAL_TABLET | Freq: Every day | ORAL | 3 refills | Status: DC

## 2020-04-07 MED ORDER — METHYLPREDNISOLONE 4 MG PO TBPK: ORAL_TABLET | ORAL | 0 refills | Status: DC

## 2020-04-07 MED ORDER — TRIAMCINOLONE ACETONIDE 40 MG/ML IJ SUSP
20.0000 mg | Freq: Once | INTRAMUSCULAR | Status: AC
  Administered 2020-04-07: 20 mg

## 2020-04-07 NOTE — Progress Notes (Signed)
Subjective:  Patient ID: Gloria Hudson, female    DOB: 1960-10-28,  MRN: [id number] HPI Chief Complaint  Patient presents with  . Foot Pain    Plantar heel left - aching x couple months, AM pain, tried stretching per Dr. Danielle Dess, uses acewrap at night-no help  . New Patient (Initial Visit)    60 y.o. female presents with the above complaint.   ROS: Denies fever chills nausea vomiting muscle aches pains calf pain back pain chest pain shortness of breath.  Past Medical History:  Diagnosis Date  . Anemia   . GERD (gastroesophageal reflux disease)   . Hyperlipidemia   . Hypertension   . Vitamin D deficiency    Past Surgical History:  Procedure Laterality Date  . HAMMER TOE SURGERY Left 2009   3rd, 4th, and 5th toes    Current Outpatient Medications:  .  Biotin w/ Vitamins C & E (HAIR/SKIN/NAILS PO), Take by mouth., Disp: , Rfl:  .  meloxicam (MOBIC) 15 MG tablet, Take 1 tablet (15 mg total) by mouth daily., Disp: 30 tablet, Rfl: 3 .  methylPREDNISolone (MEDROL DOSEPAK) 4 MG TBPK tablet, 6 day dose pack - take as directed, Disp: 21 tablet, Rfl: 0 .  atorvastatin (LIPITOR) 40 MG tablet, Take 40 mg by mouth every other day., Disp: , Rfl:  .  Cholecalciferol (VITAMIN D) 2000 UNITS tablet, Take 2,000 Units by mouth 4 (four) times daily., Disp: , Rfl:  .  cyclobenzaprine (FLEXERIL) 10 MG tablet, Take 1/2 to 1 tablet by mouth three times daily as needed for spasm, Disp: 90 tablet, Rfl: 0 .  Ferrous Sulfate (SLOW FE PO), Take by mouth daily., Disp: , Rfl:  .  hydrochlorothiazide (HYDRODIURIL) 25 MG tablet, Take one tablet by mouth every morning, Disp: 90 tablet, Rfl: 1 .  Magnesium 250 MG TABS, Take by mouth daily., Disp: , Rfl:  .  Multiple Vitamins-Minerals (MULTIVITAMIN WITH MINERALS) tablet, Take 1 tablet by mouth daily., Disp: , Rfl:  .  omeprazole (PRILOSEC) 40 MG capsule, Take one capsule by mouth   nightly at bedtime for acid reflux, Disp: 30 capsule, Rfl: 3  No Known  Allergies Review of Systems Objective:  There were no vitals filed for this visit.  General: Well developed, nourished, in no acute distress, alert and oriented x3   Dermatological: Skin is warm, dry and supple bilateral. Nails x 10 are well maintained; remaining integument appears unremarkable at this time. There are no open sores, no preulcerative lesions, no rash or signs of infection present.  Vascular: Dorsalis Pedis artery and Posterior Tibial artery pedal pulses are 2/4 bilateral with immedate capillary fill time. Pedal hair growth present. No varicosities and no lower extremity edema present bilateral.   Neruologic: Grossly intact via light touch bilateral. Vibratory intact via tuning fork bilateral. Protective threshold with Semmes Wienstein monofilament intact to all pedal sites bilateral. Patellar and Achilles deep tendon reflexes 2+ bilateral. No Babinski or clonus noted bilateral.   Musculoskeletal: No gross boney pedal deformities bilateral. No pain, crepitus, or limitation noted with foot and ankle range of motion bilateral. Muscular strength 5/5 in all groups tested bilateral.  Gait: Unassisted, Nonantalgic.    Radiographs:  Radiographs taken today left foot demonstrates osseously mature individual soft tissue increase in density plantar fascial kidney insertion site with a small plantar distally oriented calcaneal heel spur.  Mild pes planus is noted.  No acute findings are noted otherwise  Assessment & Plan:   Assessment: Planter fasciitis left foot.  Mild pes planus left foot.  Plan: Discussed etiology pathology conservative versus surgical therapies.  At this point injected her left heel at the plantar fascia insertion site.  20 mg Kenalog 5 mg Marcaine point of maximal tenderness tolerated the procedure well.  Started her on methylprednisolone to be followed by meloxicam.  Also placed her in a plantar fascial brace to be followed by a night splint of the evenings we  discussed appropriate shoe gear stretching exercises ice therapy shoe gear modifications.  I will follow-up with her in 1 month should she have questions or concerns she will notify us immediately.     [name], [place]

## 2020-04-07 NOTE — Patient Instructions (Signed)

## 2020-05-12 ENCOUNTER — Ambulatory Visit: Payer: PRIVATE HEALTH INSURANCE | Admitting: Podiatry

## 2020-06-02 ENCOUNTER — Encounter: Payer: Self-pay | Admitting: Podiatry

## 2020-06-02 ENCOUNTER — Ambulatory Visit: Payer: PRIVATE HEALTH INSURANCE | Admitting: Podiatry

## 2020-06-02 ENCOUNTER — Other Ambulatory Visit: Payer: Self-pay

## 2020-06-02 DIAGNOSIS — M722 Plantar fascial fibromatosis: Secondary | ICD-10-CM

## 2020-06-04 NOTE — Progress Notes (Signed)
She states that is been doing a lot better she refers to the Planter fasciitis of her left heel.  The brace really seems to be helping.  Objective: Vital signs are stable alert oriented x3.  Pulses are unchanged.  She has diminished pain to palpation Nupercaine tubercle of left heel.  Assessment: Well-healing plantar fasciitis.  Plan: Currently she is going continue all conservative therapies and I will follow-up with her in 1 month.

## 2020-07-07 ENCOUNTER — Ambulatory Visit: Payer: PRIVATE HEALTH INSURANCE | Admitting: Podiatry

## 2020-07-07 ENCOUNTER — Other Ambulatory Visit: Payer: Self-pay

## 2020-07-07 DIAGNOSIS — M722 Plantar fascial fibromatosis: Secondary | ICD-10-CM | POA: Diagnosis not present

## 2020-07-07 MED ORDER — TRIAMCINOLONE ACETONIDE 40 MG/ML IJ SUSP
20.0000 mg | Freq: Once | INTRAMUSCULAR | Status: AC
Start: 2020-07-07 — End: 2020-07-07
  Administered 2020-07-07: 20 mg

## 2020-07-07 NOTE — Progress Notes (Signed)
She presents today for follow-up of her Planter fasciitis left.  States that she was doing great and then about 2 weeks ago it started acting up again.  She states that she is not sure what may have caused that, but she assures me that there is no changes in her shoe gear or her activities.  Objective: She has pain with palpation MucoClear tubercle of the left heel pulses remain palpable no open lesions or wounds are noted.  Assessment: Recurrence of plan fasciitis left cannot rule out a plantar fascial tear.  Plan: I went ahead and reinjected her heel today 10 mg Kenalog 5 mg Marcaine point maximal tenderness.  Should this not resolve or recur similarly an MRI will be necessary.

## 2020-07-12 ENCOUNTER — Other Ambulatory Visit: Payer: Self-pay | Admitting: Internal Medicine

## 2020-07-12 ENCOUNTER — Other Ambulatory Visit: Payer: Self-pay

## 2020-07-12 DIAGNOSIS — Z1231 Encounter for screening mammogram for malignant neoplasm of breast: Secondary | ICD-10-CM

## 2020-07-27 ENCOUNTER — Other Ambulatory Visit: Payer: Self-pay | Admitting: Podiatry

## 2020-07-27 NOTE — Telephone Encounter (Signed)
Please advise 

## 2020-08-16 ENCOUNTER — Ambulatory Visit
Admission: RE | Admit: 2020-08-16 | Discharge: 2020-08-16 | Disposition: A | Discharge status: Home / Self Care | Payer: No Typology Code available for payment source | Source: Ambulatory Visit | Attending: Internal Medicine | Admitting: Internal Medicine

## 2020-08-16 ENCOUNTER — Other Ambulatory Visit: Payer: Self-pay

## 2020-08-16 DIAGNOSIS — Z1231 Encounter for screening mammogram for malignant neoplasm of breast: Secondary | ICD-10-CM

## 2020-11-19 ENCOUNTER — Other Ambulatory Visit: Payer: Self-pay | Admitting: Podiatry

## 2021-07-18 ENCOUNTER — Ambulatory Visit (INDEPENDENT_AMBULATORY_CARE_PROVIDER_SITE_OTHER): Payer: No Typology Code available for payment source

## 2021-07-18 ENCOUNTER — Encounter: Payer: Self-pay | Admitting: Podiatry

## 2021-07-18 ENCOUNTER — Ambulatory Visit: Payer: No Typology Code available for payment source | Admitting: Podiatry

## 2021-07-18 ENCOUNTER — Ambulatory Visit: Payer: No Typology Code available for payment source

## 2021-07-18 DIAGNOSIS — M76822 Posterior tibial tendinitis, left leg: Secondary | ICD-10-CM

## 2021-07-18 DIAGNOSIS — M722 Plantar fascial fibromatosis: Secondary | ICD-10-CM | POA: Diagnosis not present

## 2021-07-18 MED ORDER — DEXAMETHASONE SODIUM PHOSPHATE 120 MG/30ML IJ SOLN
4.0000 mg | Freq: Once | INTRAMUSCULAR | Status: AC
  Administered 2021-07-18: 4 mg via INTRA_ARTICULAR

## 2021-07-18 MED ORDER — MELOXICAM 15 MG PO TABS: 15.0000 mg | ORAL_TABLET | Freq: Every day | ORAL | 3 refills | Status: DC

## 2021-07-18 NOTE — Progress Notes (Signed)
  Subjective:  Patient ID: Gloria Hudson, female    DOB: 1960/07/25,   MRN: [id number]  No chief complaint on file.   61 y.o. female presents for concern of right heel pain. She has been seen for plantar fasciitis in the past by Dr. Milinda Pointer and had injections that have not helped on the left foot. Relates for the past 6 months has had worsening pain on the right that has been sharp and burning . States she has tried meloxicam but believes it causes her ankle to swell more.  Denies any other pedal complaints. Denies n/v/f/c.   Past Medical History:  Diagnosis Date   Anemia    GERD (gastroesophageal reflux disease)    Hyperlipidemia    Hypertension    Vitamin D deficiency     Objective:  Physical Exam: Vascular: DP/PT pulses 2/4 bilateral. CFT <3 seconds. Normal hair growth on digits. No edema.  Skin. No lacerations or abrasions bilateral feet.  Musculoskeletal: MMT 5/5 bilateral lower extremities in DF, PF, Inversion and Eversion. Deceased ROM in DF of ankle joint. Tender to medial calcaneal tubercle on the left. No pain in achilles or arch. No pain with calcaneal squeeze. Pain along PT tendon Neurological: Sensation intact to light touch.   Assessment:   1. Plantar fasciitis of left foot      Plan:  Patient was evaluated and treated and all questions answered. Discussed plantar fasciitis and PTTD with patient.  X-rays reviewed and discussed with patient. No acute fractures or dislocations noted. Mild spurring noted at inferior calcaneus.  Discussed treatment options including, ice, NSAIDS, supportive shoes, bracing, and stretching. Stretching exercises provided to be done on a daily basis.   Prescription for meloxicam provided and sent to pharmacy. Ankle brace dispensed.  Patient requesting injection today. Procedure note below.   Follow-up 6 weeks or sooner if any problems arise. In the meantime, encouraged to call the office with any questions, concerns, change in symptoms.    Procedure:  Discussed etiology, pathology, conservative vs. surgical therapies. At this time a plantar fascial injection was recommended.  The patient agreed and a sterile skin prep was applied.  An injection consisting of  dexamethasone and marcaine mixture was infiltrated at the point of maximal tenderness on the left Heel and along PT tendon.  Bandaid applied. The patient tolerated this well and was given instructions for aftercare.     Lorenda Peck, DPM

## 2021-07-18 NOTE — Patient Instructions (Signed)
Posterior Tibial Tendinitis Rehab Ask your health care provider which exercises are safe for you. Do exercises exactly as told by your health care provider and adjust them as directed. It is normal to feel mild stretching, pulling, tightness, or discomfort as you do these exercises. Stop right away if you feel sudden pain or your pain gets worse. Do not begin these exercises until told by your health care provider. Stretching and range-of-motion exercises These exercises warm up your muscles and joints and improve the movement and flexibility in your ankle and foot. These exercises may also help to relieve pain. Standing wall calf stretch, knee straight  Stand with your hands against a wall. Extend your left / right leg behind you, and bend your front knee slightly. If directed, place a folded washcloth under the arch of your foot for support. Point the toes of your back foot slightly inward. Keeping your heels on the floor and your back knee straight, shift your weight toward the wall. Do not allow your back to arch. You should feel a gentle stretch in your upper left / right calf. Hold this position for __________ seconds. Repeat __________ times. Complete this exercise __________ times a day. Standing wall calf stretch, knee bent Stand with your hands against a wall. Extend your left / right leg behind you, and bend your front knee slightly. If directed, place a folded washcloth under the arch of your foot for support. Point the toes of your back foot slightly inward. Unlock your back knee so it is bent. Keep your heels on the floor. You should feel a gentle stretch deep in your lower left / right calf. Hold this position for __________ seconds. Repeat __________ times. Complete this exercise __________ times a day. Strengthening exercises These exercises build strength and endurance in your ankle and foot. Endurance is the ability to use your muscles for a long time, even after they get  tired. Ankle inversion with band Secure one end of a rubber exercise band or tubing to a fixed object, such as a table leg or a pole, that will stay still when the band is pulled. Loop the other end of the band around the middle of your left / right foot. Sit on the floor facing the object with your left / right leg extended. The band or tube should be slightly tense when your foot is relaxed. Leading with your big toe, slowly bring your left / right foot and ankle inward, toward your other foot (inversion). Hold this position for __________ seconds. Slowly return your foot to the starting position. Repeat __________ times. Complete this exercise __________ times a day. Towel curls  Sit in a chair on a non-carpeted surface, and put your feet on the floor. Place a towel in front of your feet. If told by your health care provider, add a __________ weight to the end of the towel. Keeping your heel on the floor, put your left / right foot on the towel. Pull the towel toward you by grabbing the towel with your toes and curling them under. Keep your heel on the floor while you do this. Let your toes relax. Grab the towel with your toes again. Keep going until the towel is completely underneath your foot. Repeat __________ times. Complete this exercise __________ times a day. Balance exercise This exercise improves or maintains your balance. Balance is important in preventing falls. Single leg stand Without wearing shoes, stand near a railing or in a doorway. You may hold  on to the railing or door frame as needed for balance. Stand on your left / right foot. Keep your big toe down on the floor and try to keep your arch lifted. If balancing in this position is too easy, try the exercise with your eyes closed or while standing on a pillow. Hold this position for __________ seconds. Repeat __________ times. Complete this exercise __________ times a day. This information is not intended to replace  advice given to you by your health care provider. Make sure you discuss any questions you have with your health care provider. Document Revised: 05/27/2018 Document Reviewed: 04/02/2018 [employer] Patient Education  2023 [employer]  Plantar Fasciitis (Heel Spur Syndrome) with Rehab The plantar fascia is a fibrous, ligament-like, soft-tissue structure that spans the bottom of the foot. Plantar fasciitis is a condition that causes pain in the foot due to inflammation of the tissue. SYMPTOMS  Pain and tenderness on the underneath side of the foot. Pain that worsens with standing or walking. CAUSES  Plantar fasciitis is caused by irritation and injury to the plantar fascia on the underneath side of the foot. Common mechanisms of injury include: Direct trauma to bottom of the foot. Damage to a small nerve that runs under the foot where the main fascia attaches to the heel bone. Stress placed on the plantar fascia due to bone spurs. RISK INCREASES WITH:  Activities that place stress on the plantar fascia (running, jumping, pivoting, or cutting). Poor strength and flexibility. Improperly fitted shoes. Tight calf muscles. Flat feet. Failure to warm-up properly before activity. Obesity. PREVENTION Warm up and stretch properly before activity. Allow for adequate recovery between workouts. Maintain physical fitness: Strength, flexibility, and endurance. Cardiovascular fitness. Maintain a health body weight. Avoid stress on the plantar fascia. Wear properly fitted shoes, including arch supports for individuals who have flat feet.  PROGNOSIS  If treated properly, then the symptoms of plantar fasciitis usually resolve without surgery. However, occasionally surgery is necessary.  RELATED COMPLICATIONS  Recurrent symptoms that may result in a chronic condition. Problems of the lower back that are caused by compensating for the injury, such as limping. Pain or weakness of the foot during  push-off following surgery. Chronic inflammation, scarring, and partial or complete fascia tear, occurring more often from repeated injections.  TREATMENT  Treatment initially involves the use of ice and medication to help reduce pain and inflammation. The use of strengthening and stretching exercises may help reduce pain with activity, especially stretches of the Achilles tendon. These exercises may be performed at home or with a therapist. Your caregiver may recommend that you use heel cups of arch supports to help reduce stress on the plantar fascia. Occasionally, corticosteroid injections are given to reduce inflammation. If symptoms persist for greater than 6 months despite non-surgical (conservative), then surgery may be recommended.   MEDICATION  If pain medication is necessary, then nonsteroidal anti-inflammatory medications, such as aspirin and ibuprofen, or other minor pain relievers, such as acetaminophen, are often recommended. Do not take pain medication within 7 days before surgery. Prescription pain relievers may be given if deemed necessary by your caregiver. Use only as directed and only as much as you need. Corticosteroid injections may be given by your caregiver. These injections should be reserved for the most serious cases, because they may only be given a certain number of times.  HEAT AND COLD Cold treatment (icing) relieves pain and reduces inflammation. Cold treatment should be applied for 10 to 15 minutes every  2 to 3 hours for inflammation and pain and immediately after any activity that aggravates your symptoms. Use ice packs or massage the area with a piece of ice (ice massage). Heat treatment may be used prior to performing the stretching and strengthening activities prescribed by your caregiver, physical therapist, or athletic trainer. Use a heat pack or soak the injury in warm water.  SEEK IMMEDIATE MEDICAL CARE IF: Treatment seems to offer no benefit, or the  condition worsens. Any medications produce adverse side effects.  EXERCISES- RANGE OF MOTION (ROM) AND STRETCHING EXERCISES - Plantar Fasciitis (Heel Spur Syndrome) These exercises may help you when beginning to rehabilitate your injury. Your symptoms may resolve with or without further involvement from your physician, physical therapist or athletic trainer. While completing these exercises, remember:  Restoring tissue flexibility helps normal motion to return to the joints. This allows healthier, less painful movement and activity. An effective stretch should be held for at least 30 seconds. A stretch should never be painful. You should only feel a gentle lengthening or release in the stretched tissue.  RANGE OF MOTION - Toe Extension, Flexion Sit with your right / left leg crossed over your opposite knee. Grasp your toes and gently pull them back toward the top of your foot. You should feel a stretch on the bottom of your toes and/or foot. Hold this stretch for 10 seconds. Now, gently pull your toes toward the bottom of your foot. You should feel a stretch on the top of your toes and or foot. Hold this stretch for 10 seconds. Repeat  times. Complete this stretch 3 times per day.   RANGE OF MOTION - Ankle Dorsiflexion, Active Assisted Remove shoes and sit on a chair that is preferably not on a carpeted surface. Place right / left foot under knee. Extend your opposite leg for support. Keeping your heel down, slide your right / left foot back toward the chair until you feel a stretch at your ankle or calf. If you do not feel a stretch, slide your bottom forward to the edge of the chair, while still keeping your heel down. Hold this stretch for 10 seconds. Repeat 3 times. Complete this stretch 2 times per day.   STRETCH  Gastroc, Standing Place hands on wall. Extend right / left leg, keeping the front knee somewhat bent. Slightly point your toes inward on your back foot. Keeping your right  / left heel on the floor and your knee straight, shift your weight toward the wall, not allowing your back to arch. You should feel a gentle stretch in the right / left calf. Hold this position for 10 seconds. Repeat 3 times. Complete this stretch 2 times per day.  STRETCH  Soleus, Standing Place hands on wall. Extend right / left leg, keeping the other knee somewhat bent. Slightly point your toes inward on your back foot. Keep your right / left heel on the floor, bend your back knee, and slightly shift your weight over the back leg so that you feel a gentle stretch deep in your back calf. Hold this position for 10 seconds. Repeat 3 times. Complete this stretch 2 times per day.  STRETCH  Gastrocsoleus, Standing  Note: This exercise can place a lot of stress on your foot and ankle. Please complete this exercise only if specifically instructed by your caregiver.  Place the ball of your right / left foot on a step, keeping your other foot firmly on the same step. Hold on to the  wall or a rail for balance. Slowly lift your other foot, allowing your body weight to press your heel down over the edge of the step. You should feel a stretch in your right / left calf. Hold this position for 10 seconds. Repeat this exercise with a slight bend in your right / left knee. Repeat 3 times. Complete this stretch 2 times per day.   STRENGTHENING EXERCISES - Plantar Fasciitis (Heel Spur Syndrome)  These exercises may help you when beginning to rehabilitate your injury. They may resolve your symptoms with or without further involvement from your physician, physical therapist or athletic trainer. While completing these exercises, remember:  Muscles can gain both the endurance and the strength needed for everyday activities through controlled exercises. Complete these exercises as instructed by your physician, physical therapist or athletic trainer. Progress the resistance and repetitions only as  guided.  STRENGTH - Towel Curls Sit in a chair positioned on a non-carpeted surface. Place your foot on a towel, keeping your heel on the floor. Pull the towel toward your heel by only curling your toes. Keep your heel on the floor. Repeat 3 times. Complete this exercise 2 times per day.  STRENGTH - Ankle Inversion Secure one end of a rubber exercise band/tubing to a fixed object (table, pole). Loop the other end around your foot just before your toes. Place your fists between your knees. This will focus your strengthening at your ankle. Slowly, pull your big toe up and in, making sure the band/tubing is positioned to resist the entire motion. Hold this position for 10 seconds. Have your muscles resist the band/tubing as it slowly pulls your foot back to the starting position. Repeat 3 times. Complete this exercises 2 times per day.  Document Released: 01/29/2005 Document Revised: 04/23/2011 Document Reviewed: 05/13/2008 [facility] Patient Information 2014 [place], [place].

## 2021-07-19 ENCOUNTER — Other Ambulatory Visit: Payer: Self-pay | Admitting: Internal Medicine

## 2021-07-19 ENCOUNTER — Other Ambulatory Visit: Payer: Self-pay | Admitting: Obstetrics and Gynecology

## 2021-07-19 DIAGNOSIS — Z1231 Encounter for screening mammogram for malignant neoplasm of breast: Secondary | ICD-10-CM

## 2021-08-18 ENCOUNTER — Ambulatory Visit
Admission: RE | Admit: 2021-08-18 | Discharge: 2021-08-18 | Disposition: A | Discharge status: Home / Self Care | Payer: No Typology Code available for payment source | Source: Ambulatory Visit | Attending: Internal Medicine | Admitting: Internal Medicine

## 2021-08-18 DIAGNOSIS — Z1231 Encounter for screening mammogram for malignant neoplasm of breast: Secondary | ICD-10-CM

## 2021-08-29 ENCOUNTER — Ambulatory Visit: Payer: No Typology Code available for payment source | Admitting: Podiatry

## 2021-08-29 ENCOUNTER — Encounter: Payer: Self-pay | Admitting: Podiatry

## 2021-08-29 DIAGNOSIS — M722 Plantar fascial fibromatosis: Secondary | ICD-10-CM | POA: Diagnosis not present

## 2021-08-29 DIAGNOSIS — M76822 Posterior tibial tendinitis, left leg: Secondary | ICD-10-CM | POA: Diagnosis not present

## 2021-08-29 MED ORDER — DEXAMETHASONE SODIUM PHOSPHATE 120 MG/30ML IJ SOLN
4.0000 mg | Freq: Once | INTRAMUSCULAR | Status: AC
  Administered 2021-08-29: 4 mg via INTRA_ARTICULAR

## 2021-08-29 NOTE — Progress Notes (Signed)
  Subjective:  Patient ID: Gloria Hudson, female    DOB: 03/13/1960,   MRN: [id number]  Chief Complaint  Patient presents with   Plantar Fasciitis    Patient states right foot is about the same since the last visit , constant pain within the heel area, sharp shooting pain. No swelling or redness , Patient states Pf brace is helpful some.    61 y.o. female presents for  follow-up of left plantar fasciitis and PTTD. Relates it is about the same. States the brace helps and ankle is feeling better but the bottom of the heel still hurts. Has been stretching as intructed.   .  Denies any other pedal complaints. Denies n/v/f/c.   Past Medical History:  Diagnosis Date   Anemia    GERD (gastroesophageal reflux disease)    Hyperlipidemia    Hypertension    Vitamin D deficiency     Objective:  Physical Exam: Vascular: DP/PT pulses 2/4 bilateral. CFT <3 seconds. Normal hair growth on digits. No edema.  Skin. No lacerations or abrasions bilateral feet.  Musculoskeletal: MMT 5/5 bilateral lower extremities in DF, PF, Inversion and Eversion. Deceased ROM in DF of ankle joint. Tender to medial calcaneal tubercle on the left has improved but still present. . No pain in achilles or arch. No pain with calcaneal squeeze. Pain along PT tendon has imrpoved.  Neurological: Sensation intact to light touch.   Assessment:   1. Plantar fasciitis of left foot   2. Posterior tibial tendon dysfunction (PTTD) of left lower extremity      Plan:  Patient was evaluated and treated and all questions answered. Discussed plantar fasciitis and PTTD with patient.  X-rays reviewed and discussed with patient. No acute fractures or dislocations noted. Mild spurring noted at inferior calcaneus.  Discussed treatment options including, ice, NSAIDS, supportive shoes, bracing, and stretching.  Continue with meloxicam and brace.  Continue stretching.  Patient requesting injection today. Procedure note below.    Follow-up up as needed.   Procedure:  Discussed etiology, pathology, conservative vs. surgical therapies. At this time a plantar fascial injection was recommended.  The patient agreed and a sterile skin prep was applied.  An injection consisting of  dexamethasone and marcaine mixture was infiltrated at the point of maximal tenderness on the left Heel.  Bandaid applied. The patient tolerated this well and was given instructions for aftercare.     Louann Sjogren, DPM

## 2021-10-18 DIAGNOSIS — R42 Dizziness and giddiness: Secondary | ICD-10-CM | POA: Diagnosis not present

## 2021-10-18 DIAGNOSIS — R7301 Impaired fasting glucose: Secondary | ICD-10-CM | POA: Diagnosis not present

## 2021-12-18 IMAGING — MG MM DIGITAL SCREENING BILAT W/ TOMO AND CAD
6 of 10 series · 6 of 30 positions shown · non-contrast
Comparison: Previous exam(s).

CLINICAL DATA: Screening.

EXAM:
DIGITAL SCREENING BILATERAL MAMMOGRAM WITH TOMOSYNTHESIS AND CAD
TECHNIQUE: Bilateral screening digital craniocaudal and mediolateral oblique
mammograms were obtained. Bilateral screening digital breast
tomosynthesis was performed. The images were evaluated with
computer-aided detection.

[R MLO synth-2D (1 of 2)]
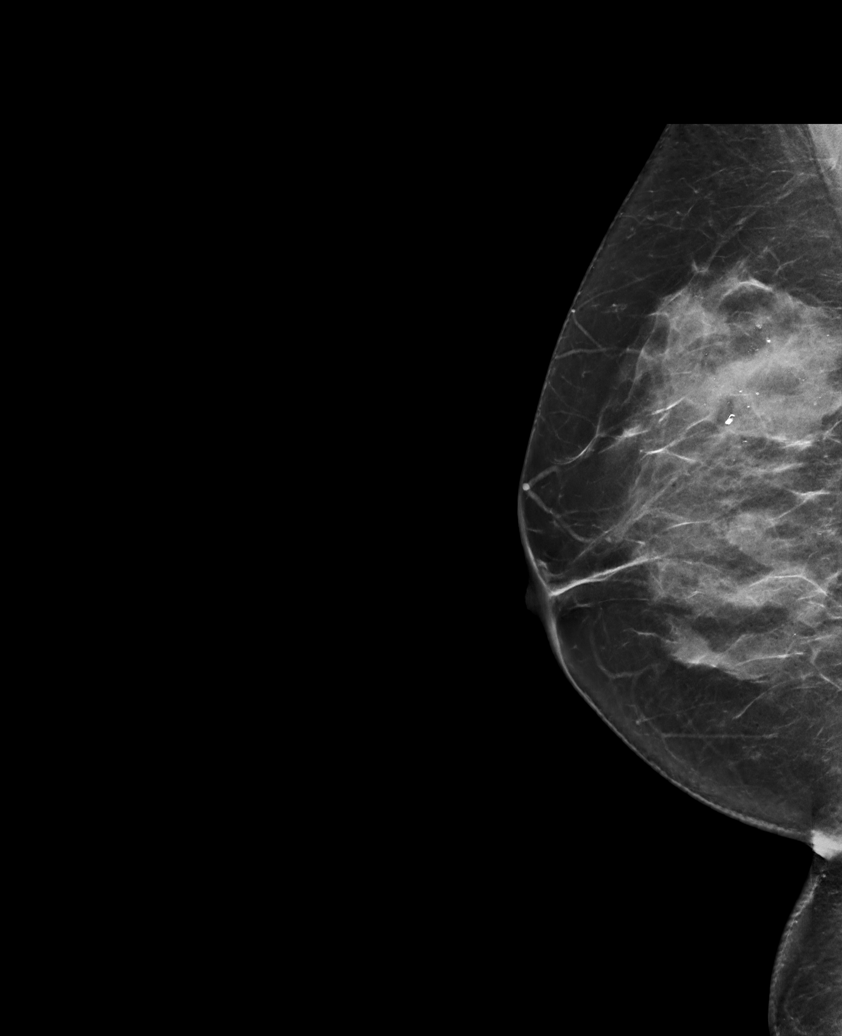

[L CC synth-2D]
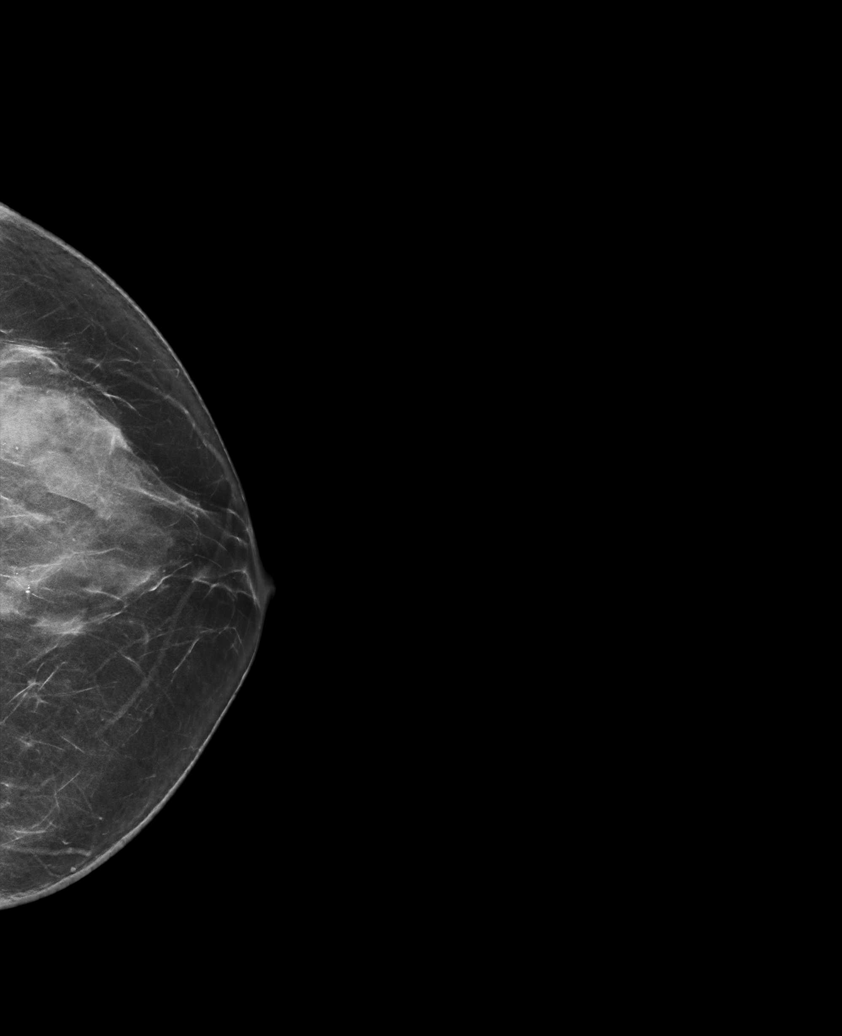

[R CC synth-2D]
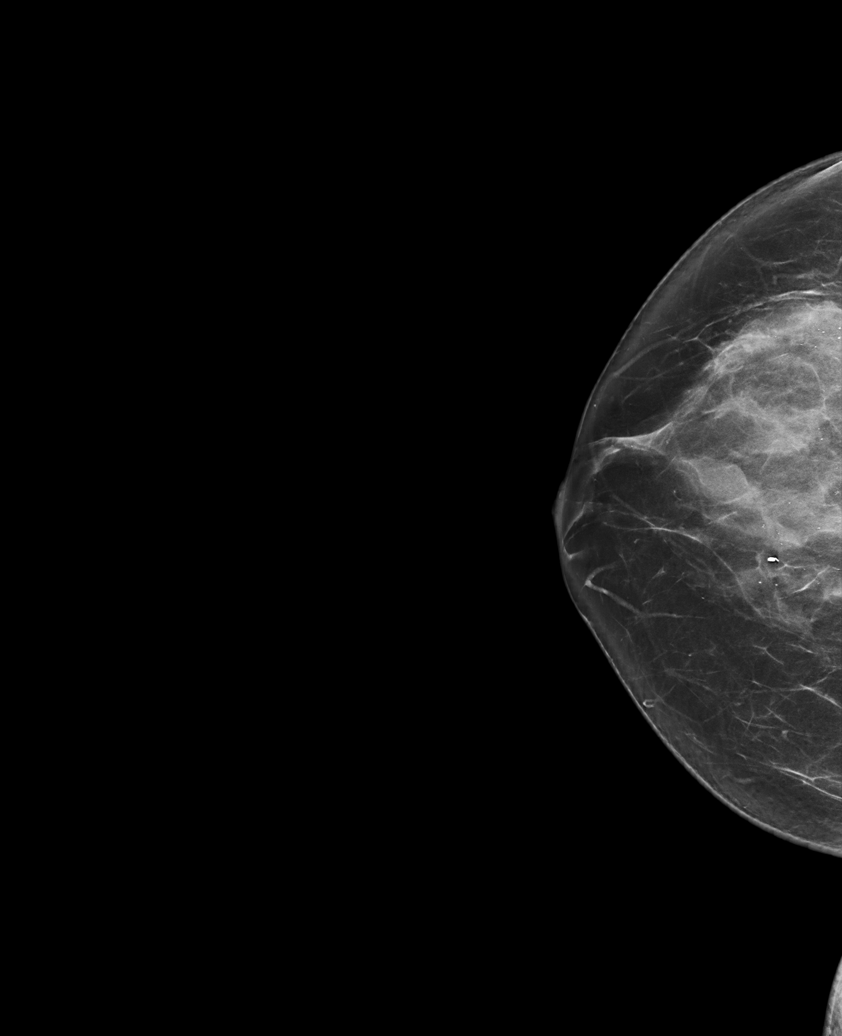

[L MLO synth-2D]
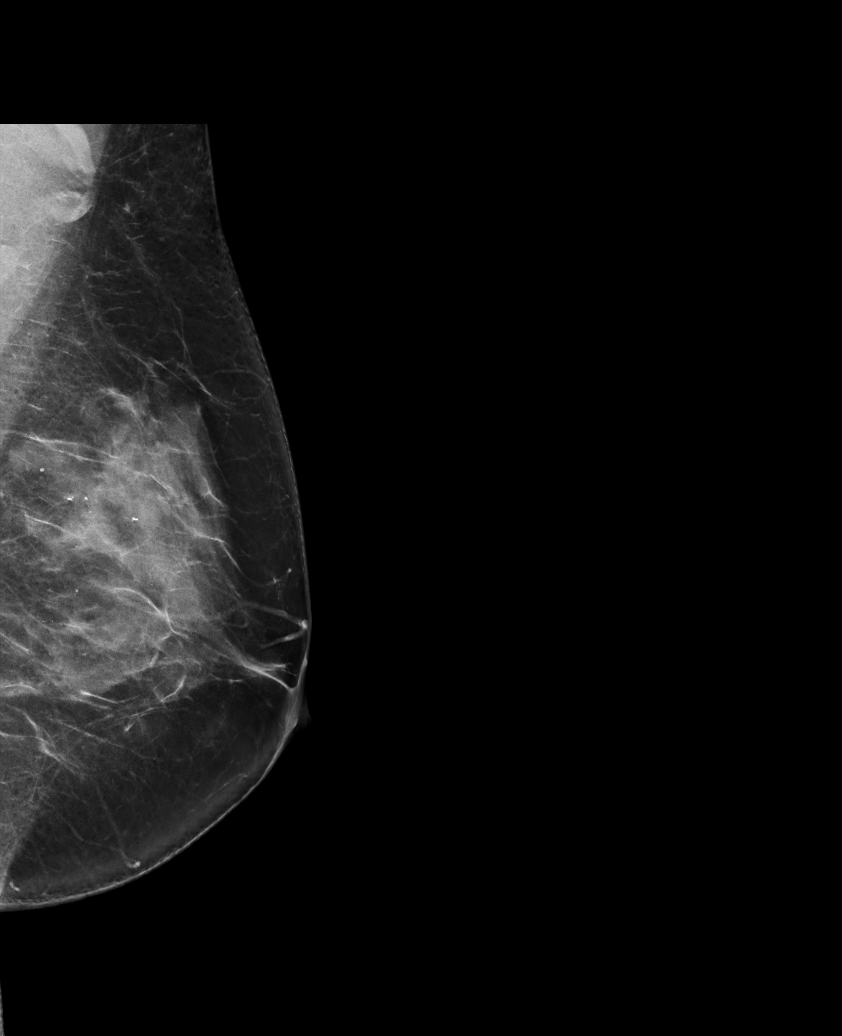

[R MLO synth-2D (2 of 2)]
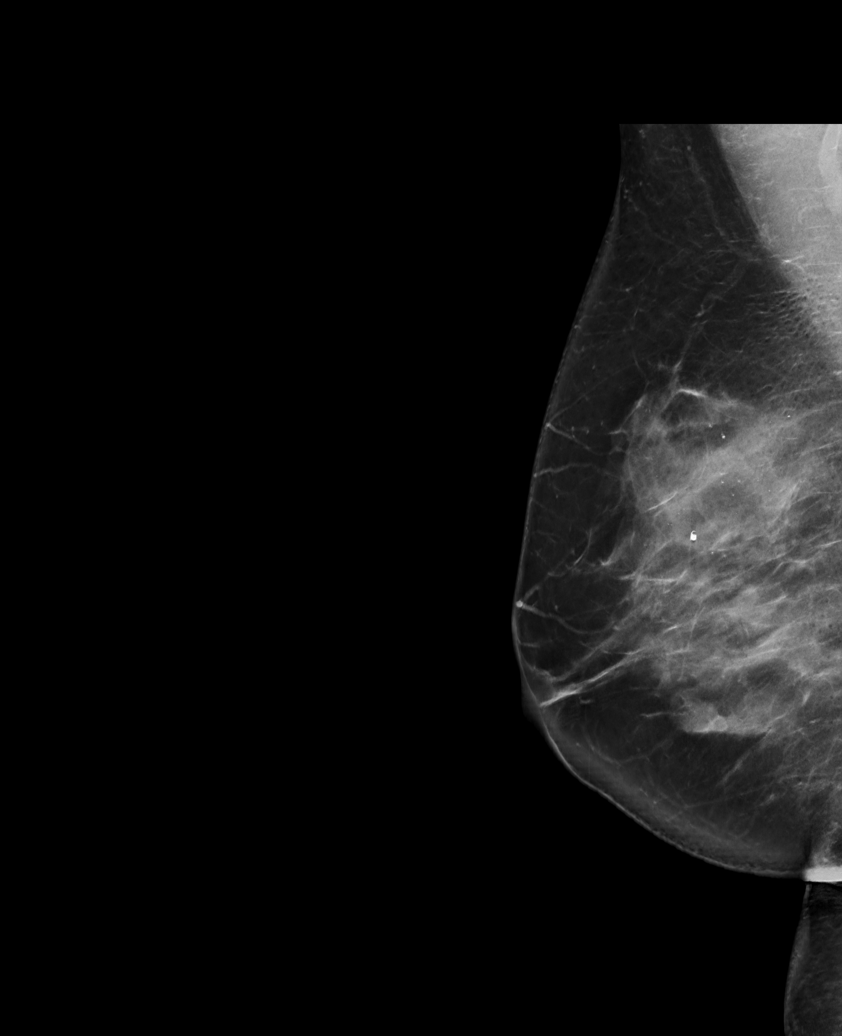

[R MLO tomo · tomo slice 40/79.0]
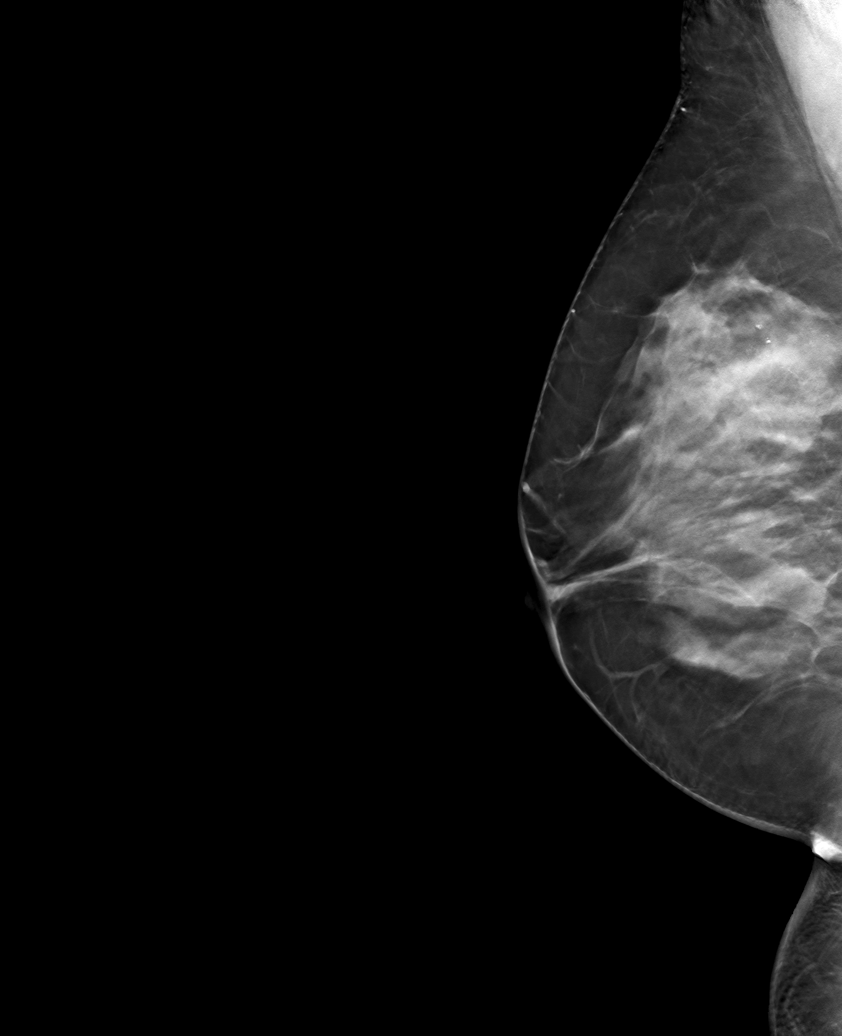

[6 of 30 positions shown; findings below may reference images not displayed]

ACR Breast Density Category d: The breast tissue is extremely dense,
which lowers the sensitivity of mammography
FINDINGS: There are no findings suspicious for malignancy.
IMPRESSION: No mammographic evidence of malignancy. A result letter of this
screening mammogram will be mailed directly to the patient.

RECOMMENDATION:
Screening mammogram in one year. (Code:TA-V-WV9)

BI-RADS CATEGORY  1: Negative.

## 2022-01-16 DIAGNOSIS — E785 Hyperlipidemia, unspecified: Secondary | ICD-10-CM | POA: Diagnosis not present

## 2022-01-16 DIAGNOSIS — D509 Iron deficiency anemia, unspecified: Secondary | ICD-10-CM | POA: Diagnosis not present

## 2022-01-23 DIAGNOSIS — R82998 Other abnormal findings in urine: Secondary | ICD-10-CM | POA: Diagnosis not present

## 2022-01-23 DIAGNOSIS — Z1339 Encounter for screening examination for other mental health and behavioral disorders: Secondary | ICD-10-CM | POA: Diagnosis not present

## 2022-01-23 DIAGNOSIS — Z0000 Encounter for general adult medical examination without abnormal findings: Secondary | ICD-10-CM | POA: Diagnosis not present

## 2022-01-23 DIAGNOSIS — Z1331 Encounter for screening for depression: Secondary | ICD-10-CM | POA: Diagnosis not present

## 2022-01-23 DIAGNOSIS — E1169 Type 2 diabetes mellitus with other specified complication: Secondary | ICD-10-CM | POA: Diagnosis not present

## 2022-01-23 DIAGNOSIS — I10 Essential (primary) hypertension: Secondary | ICD-10-CM | POA: Diagnosis not present

## 2022-04-19 DIAGNOSIS — Z01419 Encounter for gynecological examination (general) (routine) without abnormal findings: Secondary | ICD-10-CM | POA: Diagnosis not present

## 2022-04-19 DIAGNOSIS — Z124 Encounter for screening for malignant neoplasm of cervix: Secondary | ICD-10-CM | POA: Diagnosis not present

## 2022-04-24 DIAGNOSIS — E785 Hyperlipidemia, unspecified: Secondary | ICD-10-CM | POA: Diagnosis not present

## 2022-04-24 DIAGNOSIS — Z1331 Encounter for screening for depression: Secondary | ICD-10-CM | POA: Diagnosis not present

## 2022-04-24 DIAGNOSIS — D509 Iron deficiency anemia, unspecified: Secondary | ICD-10-CM | POA: Diagnosis not present

## 2022-04-24 DIAGNOSIS — Z1339 Encounter for screening examination for other mental health and behavioral disorders: Secondary | ICD-10-CM | POA: Diagnosis not present

## 2022-04-24 DIAGNOSIS — E1169 Type 2 diabetes mellitus with other specified complication: Secondary | ICD-10-CM | POA: Diagnosis not present

## 2022-04-24 DIAGNOSIS — I10 Essential (primary) hypertension: Secondary | ICD-10-CM | POA: Diagnosis not present

## 2022-07-13 ENCOUNTER — Other Ambulatory Visit: Payer: Self-pay | Admitting: Obstetrics and Gynecology

## 2022-07-13 DIAGNOSIS — Z0000 Encounter for general adult medical examination without abnormal findings: Secondary | ICD-10-CM

## 2022-08-03 DIAGNOSIS — B029 Zoster without complications: Secondary | ICD-10-CM | POA: Diagnosis not present

## 2022-08-21 ENCOUNTER — Ambulatory Visit: Payer: No Typology Code available for payment source

## 2022-09-06 ENCOUNTER — Ambulatory Visit
Admission: RE | Admit: 2022-09-06 | Discharge: 2022-09-06 | Disposition: A | Discharge status: Home / Self Care | Payer: BC Managed Care – PPO | Source: Ambulatory Visit | Attending: Obstetrics and Gynecology | Admitting: Obstetrics and Gynecology

## 2022-09-06 DIAGNOSIS — Z0000 Encounter for general adult medical examination without abnormal findings: Secondary | ICD-10-CM

## 2022-09-06 DIAGNOSIS — Z1231 Encounter for screening mammogram for malignant neoplasm of breast: Secondary | ICD-10-CM | POA: Diagnosis not present

## 2022-10-16 DIAGNOSIS — I10 Essential (primary) hypertension: Secondary | ICD-10-CM | POA: Diagnosis not present

## 2022-10-16 DIAGNOSIS — E1169 Type 2 diabetes mellitus with other specified complication: Secondary | ICD-10-CM | POA: Diagnosis not present

## 2023-01-07 ENCOUNTER — Ambulatory Visit: Payer: BC Managed Care – PPO | Admitting: Dermatology

## 2023-01-07 ENCOUNTER — Encounter: Payer: Self-pay | Admitting: Dermatology

## 2023-01-07 DIAGNOSIS — L810 Postinflammatory hyperpigmentation: Secondary | ICD-10-CM | POA: Insufficient documentation

## 2023-01-07 DIAGNOSIS — L700 Acne vulgaris: Secondary | ICD-10-CM | POA: Insufficient documentation

## 2023-01-07 DIAGNOSIS — L6681 Central centrifugal cicatricial alopecia: Secondary | ICD-10-CM | POA: Diagnosis not present

## 2023-01-07 DIAGNOSIS — L639 Alopecia areata, unspecified: Secondary | ICD-10-CM | POA: Diagnosis not present

## 2023-01-07 MED ORDER — TRIAMCINOLONE ACETONIDE 10 MG/ML IJ SUSP
5.0000 mg | Freq: Once | INTRAMUSCULAR | Status: AC
  Administered 2023-01-07: 5 mg

## 2023-01-07 MED ORDER — SAFETY SEAL MISCELLANEOUS MISC: 1.0000 | Freq: Two times a day (BID) | 2 refills | Status: DC

## 2023-01-07 NOTE — Progress Notes (Signed)
New Patient Visit   Subjective  Gloria Hudson is a 62 y.o. female who presents for the following: New Pt - Hair Loss  Patient states she has hair loss located at the scalp that she would like to have examined. Patient reports the areas have been there for 3 years. She reports the areas are not bothersome.Patient rates irritation 0 out of 10. She states that the areas have not spread. Patient reports she has previously been treated for these areas. She stated that she has used multiple OTC remedies (Rogaine, Hers, Nioxin shampoo, vitamins) Patient denies Hx of bx. Patient denies family history of skin cancer(s).  The following portions of the chart were reviewed this encounter and updated as appropriate: medications, allergies, medical history  Review of Systems:  No other skin or systemic complaints except as noted in HPI or Assessment and Plan.  Objective  Well appearing patient in no apparent distress; mood and affect are within normal limits.   A focused examination was performed of the following areas: scalp/hair   Relevant exam findings are noted in the Assessment and Plan.  Left scalp (10) ~4cm patch of smooth hairloss on left temporal scalp         Assessment & Plan    Central Centrifugal Cicatricial Alopecia (CCCA)  - Assessment: Patient presents with a 3-year history of progressive hair loss, characterized by general thinning in the center of the scalp, indicative of CCCA, and a distinct patch of hair loss suggestive of alopecia areata. Notable previous treatments include minoxidil, Rogaine, and Hers, which had limited success and caused skin irritation. Regular use of hair relaxers may have contributed to the condition. - Plan:   - Prescribe clobetasol combined with 7-8% minoxidil from [facility].   - Instruct patient to apply medications twice daily to the affected areas.   - Advise wearing a bonnet at night to prevent unwanted facial hair growth.    -  Schedule follow-up in two months.   - Recommend vitamins and collagen powder to support the efficacy of topical treatments.   [facility] will contact the patient regarding the prescription.   - Continue with the current hairstyle that conceals affected areas without causing tension.   Alopecia Areata Exam: ~4cm patch of smooth hair loss  Treatment Plan: -   - Administer Kenalog 5 injections, totaling 0.3cc, to the affected AA area on left scalp. (see procedure note below) -Topical clobetasol as prescribed above     Central centrifugal cicatricial alopecia  Related Medications triamcinolone acetonide (KENALOG) 10 MG/ML injection 5 mg   Alopecia areata (10) Left scalp  triamcinolone acetonide (KENALOG) 10 MG/ML injection 5 mg - Left scalp (10)   Safety Seal Miscellaneous MISC - Left scalp (10) 1 Application by Does not apply route 2 (two) times daily. Medication Name: AA Gel with Minoxidil USP 10% and Clobetasol USP 0.05%  Intralesional injection - Left scalp (10) Procedure Note Intralesional Injection  Location: scalp  Informed Consent: Discussed risks (infection, pain, bleeding, bruising, thinning of the skin, loss of skin pigment, lack of resolution, and recurrence of lesion) and benefits of the procedure, as well as the alternatives. Informed consent was obtained. Preparation: The area was prepared a standard fashion.  Anesthesia:none  Procedure Details: An intralesional injection was performed with Kenalog 5 mg/cc. Risk Evaluation and Mitigation Strategies (REMS)  The medication you are ordering is associated with Risk Evaluation and Mitigation Strategies (REMS) and has Elements to [employer] Use (ETASU).  Please use the link below to the FDA website to assure all ETASU are addressed for this medication.   Total number of injections: 10  Plan: The patient was instructed on post-op care. Recommend OTC analgesia as needed for pain.     Return in about  2 months (around 03/09/2023) for CCCA.    Documentation: I have reviewed the above documentation for accuracy and completeness, and I agree with the above.   I, Shirron Marcha Solders, CMA, am acting as scribe for [employer], DO.   Langston Reusing, DO

## 2023-01-07 NOTE — Patient Instructions (Addendum)
Hello Gloria Hudson,  Thank you for visiting my office today. Your dedication to addressing your hair loss concerns is greatly appreciated. Here is a summary of the key instructions we discussed during our consultation:  - Medications Prescribed:   - Clobetasol- Minoxidil Compound: Apply this medication twice daily to the affected areas on your scalp.    - Additional Treatments:   - Kenalog Injections: We initiated these injections today to expedite your results. Your progress will be evaluated during your follow-up visit.  - Lifestyle Adjustments:   - Nighttime Care: Wear a bonnet at night to prevent unwanted hair growth on your face.   - Hair Styling: Continue styling your hair in a manner that is not too tight but helps conceal thinning areas.  - Follow-Up:   - We have scheduled a follow-up in two months to assess the progress of your treatment and make any necessary adjustments.  - Pharmacy Coordination:   [facility] will be in touch regarding your prescriptions. Should you not hear from them, please reach out using the contact information provided.  - Supplements:   - It is recommended to take Viviscal vitamins and Vital Proteins collagen powder to support the effectiveness of your topical treatments.  You will receive an after-visit summary with attached pictures and further details about your treatment. We are looking forward to observing the improvements at your next appointment.  Warm regards,  Dr. Langston Reusing [facility]ermat[facility]            What is central centrifugal cicatricial alopecia? Central Centrifugal Cicatricial Alopecia (CCCA) is a form of scarring alopecia on the scalp that results in permanent hair loss. It is the most common form of scarring hair loss seen in black women. However, it may be seen in men and among persons of all races and hair colour (though rarely). Middle-aged women are most commonly affected.  What is the cause of central centrifugal  cicatricial alopecia? The exact cause of CCCA is unknown and is likely multifactorial. A genetic component has been suggested, with a link to mutations of the gene PADI3, which encodes peptidyl arginine deiminase, type III (PADI3), an enzyme that modifies proteins that are essential to formation of the hair-shaft. Hair care practices, such as the use of the hot comb, relaxers, tight extensions and weaves, have been implicated for decades, but studies have not shown a consistent link. Other proposed causative factors include fungal infections, bacterial infections, autoimmune disease, and genetics. One study has shown an association with medical conditions such as type 2 diabetes mellitus.  What patients often ask their dermatologist about CCCA Board-certified dermatologists are the medical doctors who have the most experience diagnosing and treating hair loss, including CCCA. When patients see their dermatologist about CCCA, they often ask the following questions.  Why am I going bald in the center of my head? This type of hair loss often begins in the center of the scalp as a small, balding, and round patch that grows over time.  While more common in Black women, this type of hair loss develops in men and people of all races.  Central centrifugal cicatricial alopecia (CCCA) The first sign of CCCA is often noticeable hair loss in the center, or crown, of your scalp.  Woman with CCCA has hair loss in the center of her scalp If you have this type of hair loss, you want to treat it early. Starting treatment early can prevent CCCA from spreading outward and causing more permanent hair  loss. Some people also have hair regrowth when treatment starts early.  Early treatment is important because this disease destroys hair follicles. These are tiny pores (or openings) in your scalp from which your hair grows. Once a hair follicle has been destroyed, it is replaced by scar tissue. This is why hair loss can be  permanent.  You can tell when scarring develops by looking at your scalp. After many hair follicles develop scars, you'll have a bald area that feels smooth to the touch.  Can CCCA be reversed? You may be able to reverse (or grow some hair) if you treat CCCA early before hair follicles develop scars. Once a hair follicle scars completely, treatment to regrow hair becomes difficult and hair loss is more likely to be permanent.  While treatment for CCCA may not always be able to reverse the disease and regrow hair, treatment can prevent CCCA from destroying more hair follicles. This means that the patch of hair loss that you have can remain the same size instead of getting larger. Without treatment, CCCA often continues to destroy hair follicles, and the patch of hair loss becomes larger and may eventually involve most of the scalp.  How is CCCA treated? You cannot effectively treat CCCA with hair loss treatments that you can buy online or in stores. A dermatologist must prescribe medication to treat this type of hair loss.  A dermatologist can also give you self-care tips that can make treatment more effective.  Even if you don't want to treat the hair loss, it's important to see a board-certified dermatologist if you have patchy hair loss in the center of your head. Occasionally, CCCA is a sign of a medical problem like a thyroid condition. The hair loss could also be a sign that you need more iron or certain vitamins.  If you have noticeable hair loss on the top of your head, dermatologists encourage you to make an appointment today. As tempting as it can be to hide a small area of hair loss, remember that the small area tends to get larger and larger without treatment.  Why does the baldness start from the top of the head? Exactly why CCCA usually starts on the top of the head is not completely understood.  In studying CCCA, dermatologists have learned that it is a unique type of hair loss  that usually starts on the top of the head. As CCCA progresses, the round patch grows.  Studies have also found that:  Where this type of hair loss develops, there's inflammation.  CCCA is the most common type of scarring hair loss for women of African descent.  In the [place], it's the most frequent cause of scarring hair loss in [place] American women and usually begins during middle age.  CCCA runs in families.  Noticeable hair loss is one sign of CCCA. Some people feel small, raised bumps on their scalp. Many people who have untreated CCCA say that their scalp burns, stings, or itches.  You may develop other signs or symptoms. You'll find more information about these, along with pictures at Central centrifugal cicatricial alopecia: Symptoms.    Important Information  Due to recent changes in healthcare laws, you may see results of your pathology and/or laboratory studies on MyChart before the doctors have had a chance to review them. We understand that in some cases there may be results that are confusing or concerning to you. Please understand that not all results are received at the same time  and often the doctors may need to interpret multiple results in order to provide you with the best plan of care or course of treatment. Therefore, we ask that you please give [place] 2 business days to thoroughly review all your results before contacting the office for clarification. Should we see a critical lab result, you will be contacted sooner.   If You Need Anything After Your Visit  If you have any questions or concerns for your doctor, please call our main line at [phone] If no one answers, please leave a voicemail as directed and we will return your call as soon as possible. Messages left after 4 pm will be answered the following business day.   You may also send [place] a message via MyChart. We typically respond to MyChart messages within 1-2 business days.  For prescription  refills, please ask your pharmacy to contact our office. Our fax number is [phone].  If you have an urgent issue when the clinic is closed that cannot wait until the next business day, you can page your doctor at the number below.    Please note that while we do our best to be available for urgent issues outside of office hours, we are not available 24/7.   If you have an urgent issue and are unable to reach [place], you may choose to seek medical care at your doctor's office, retail clinic, urgent care center, or emergency room.  If you have a medical emergency, please immediately call 911 or go to the emergency department. In the event of inclement weather, please call our main line at [phone] for an update on the status of any delays or closures.  Dermatology Medication Tips: Please keep the boxes that topical medications come in in order to help keep track of the instructions about where and how to use these. Pharmacies typically print the medication instructions only on the boxes and not directly on the medication tubes.   If your medication is too expensive, please contact our office at [phone] or send [place] a message through MyChart.   We are unable to tell what your co-pay for medications will be in advance as this is different depending on your insurance coverage. However, we may be able to find a substitute medication at lower cost or fill out paperwork to get insurance to cover a needed medication.   If a prior authorization is required to get your medication covered by your insurance company, please allow [place] 1-2 business days to complete this process.  Drug prices often vary depending on where the prescription is filled and some pharmacies may offer cheaper prices.  The website www.goodrx.com contains coupons for medications through different pharmacies. The prices here do not account for what the cost may be with help from insurance (it may be cheaper with your insurance),  but the website can give you the price if you did not use any insurance.  - You can print the associated coupon and take it with your prescription to the pharmacy.  - You may also stop by our office during regular business hours and pick up a GoodRx coupon card.  - If you need your prescription sent electronically to a different pharmacy, notify our office through [facility] or by phone at [phone]

## 2023-01-23 DIAGNOSIS — D509 Iron deficiency anemia, unspecified: Secondary | ICD-10-CM | POA: Diagnosis not present

## 2023-01-23 DIAGNOSIS — E785 Hyperlipidemia, unspecified: Secondary | ICD-10-CM | POA: Diagnosis not present

## 2023-01-29 DIAGNOSIS — E1169 Type 2 diabetes mellitus with other specified complication: Secondary | ICD-10-CM | POA: Diagnosis not present

## 2023-01-30 DIAGNOSIS — I10 Essential (primary) hypertension: Secondary | ICD-10-CM | POA: Diagnosis not present

## 2023-01-30 DIAGNOSIS — Z0000 Encounter for general adult medical examination without abnormal findings: Secondary | ICD-10-CM | POA: Diagnosis not present

## 2023-01-30 DIAGNOSIS — E1169 Type 2 diabetes mellitus with other specified complication: Secondary | ICD-10-CM | POA: Diagnosis not present

## 2023-01-30 DIAGNOSIS — Z1339 Encounter for screening examination for other mental health and behavioral disorders: Secondary | ICD-10-CM | POA: Diagnosis not present

## 2023-01-30 DIAGNOSIS — Z1331 Encounter for screening for depression: Secondary | ICD-10-CM | POA: Diagnosis not present

## 2023-03-12 ENCOUNTER — Encounter: Payer: Self-pay | Admitting: Dermatology

## 2023-03-12 ENCOUNTER — Ambulatory Visit (INDEPENDENT_AMBULATORY_CARE_PROVIDER_SITE_OTHER): Payer: BC Managed Care – PPO | Admitting: Dermatology

## 2023-03-12 VITALS — BP 160/100

## 2023-03-12 DIAGNOSIS — L639 Alopecia areata, unspecified: Secondary | ICD-10-CM

## 2023-03-12 DIAGNOSIS — L6681 Central centrifugal cicatricial alopecia: Secondary | ICD-10-CM

## 2023-03-12 MED ORDER — DOXYCYCLINE HYCLATE 100 MG PO TABS: 100.0000 mg | ORAL_TABLET | Freq: Two times a day (BID) | ORAL | 2 refills | Status: DC

## 2023-03-12 NOTE — Progress Notes (Signed)
   Follow-Up Visit   Subjective  Gloria Hudson is a 63 y.o. female who presents for the following: CCCA - Alopecia Areata  Patient present today for follow up visit. Patient was last evaluated on 01/07/23. Patient reports sxs are a little better as she has been applying the [facility] AA gel and taking the recommended supplements. Patient denies medication changes.  The following portions of the chart were reviewed this encounter and updated as appropriate: medications, allergies, medical history  Review of Systems:  No other skin or systemic complaints except as noted in HPI or Assessment and Plan.  Objective  Well appearing patient in no apparent distress; mood and affect are within normal limits.   A focused examination was performed of the following areas: scalp   Relevant exam findings are noted in the Assessment and Plan.             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 An isolated circular patch of complete hair loss diagnosed as alopecia areata on the left parietal scalp. Previous treatments, including a topical compound (AA gel) with clobetasol and minoxidil, and intralesional injections, have shown limited effectiveness for the alopecia areata patch.  Plan:   Continue application of the topical compound (AA gel) twice daily.   Initiate doxycycline 50mg  once daily with dinner for a duration of 3 months.   Discontinue intralesional injections.   Continue with the current regimen of vitamin supplements and collagen.   Schedule a follow-up appointment in 3 months to assess progress and adjust treatment as necessary.   Return in about 3 months (around 06/10/2023) for CCCA & Alopecia Areata.    Documentation: I have reviewed the above documentation for accuracy and completeness, and I agree with the above.   I,  Shirron Marcha Solders, CMA, am acting as scribe for [employer], DO.   Langston Reusing, DO

## 2023-03-12 NOTE — Patient Instructions (Addendum)
Hello Gloria Hudson,  Thank you for visiting my office today.  Here is a summary of the key instructions from today's consultation:  AA Gel Application: Continue applying the AA gel, which contains clobetasol and minoxidil, twice daily.   Morning and Night: Apply once in the morning and once at night.   Precaution: Cover your hair at night to prevent the gel from contacting your face.  Oral Medication: Start taking Doxycycline.   Dosage: 50 mg once daily with dinner.   Duration: This is a three-month course, totaling 90 pills.  Follow-Up: We have scheduled a follow-up appointment in three months to assess progress and adjust treatment as necessary.  Please ensure to take the Doxycycline with food to minimize the risk of an upset stomach. Continue using the topical treatment as directed, and we are hopeful for further improvement in your condition.  Best regards,  Dr. Langston Reusing, Dermatologist  Important Information  Due to recent changes in healthcare laws, you may see results of your pathology and/or laboratory studies on MyChart before the doctors have had a chance to review them. We understand that in some cases there may be results that are confusing or concerning to you. Please understand that not all results are received at the same time and often the doctors may need to interpret multiple results in order to provide you with the best plan of care or course of treatment. Therefore, we ask that you please give [place] 2 business days to thoroughly review all your results before contacting the office for clarification. Should we see a critical lab result, you will be contacted sooner.   If You Need Anything After Your Visit  If you have any questions or concerns for your doctor, please call our main line at [phone] If no one answers, please leave a voicemail as directed and we will return your call as soon as possible. Messages left after 4 pm will be answered the following business  day.   You may also send [place] a message via MyChart. We typically respond to MyChart messages within 1-2 business days.  For prescription refills, please ask your pharmacy to contact our office. Our fax number is [phone].  If you have an urgent issue when the clinic is closed that cannot wait until the next business day, you can page your doctor at the number below.    Please note that while we do our best to be available for urgent issues outside of office hours, we are not available 24/7.   If you have an urgent issue and are unable to reach [place], you may choose to seek medical care at your doctor's office, retail clinic, urgent care center, or emergency room.  If you have a medical emergency, please immediately call 911 or go to the emergency department. In the event of inclement weather, please call our main line at [phone] for an update on the status of any delays or closures.  Dermatology Medication Tips: Please keep the boxes that topical medications come in in order to help keep track of the instructions about where and how to use these. Pharmacies typically print the medication instructions only on the boxes and not directly on the medication tubes.   If your medication is too expensive, please contact our office at [phone] or send [place] a message through MyChart.   We are unable to tell what your co-pay for medications will be in advance as this is different depending on your insurance coverage. However, we may  be able to find a substitute medication at lower cost or fill out paperwork to get insurance to cover a needed medication.   If a prior authorization is required to get your medication covered by your insurance company, please allow [place] 1-2 business days to complete this process.  Drug prices often vary depending on where the prescription is filled and some pharmacies may offer cheaper prices.  The website www.goodrx.com contains coupons for medications through  different pharmacies. The prices here do not account for what the cost may be with help from insurance (it may be cheaper with your insurance), but the website can give you the price if you did not use any insurance.  - You can print the associated coupon and take it with your prescription to the pharmacy.  - You may also stop by our office during regular business hours and pick up a GoodRx coupon card.  - If you need your prescription sent electronically to a different pharmacy, notify our office through [facility] or by phone at [phone]

## 2023-06-06 ENCOUNTER — Other Ambulatory Visit: Payer: Self-pay | Admitting: Dermatology

## 2023-06-06 DIAGNOSIS — L6681 Central centrifugal cicatricial alopecia: Secondary | ICD-10-CM

## 2023-06-06 DIAGNOSIS — L639 Alopecia areata, unspecified: Secondary | ICD-10-CM

## 2023-06-13 ENCOUNTER — Ambulatory Visit: Payer: BC Managed Care – PPO | Admitting: Dermatology

## 2023-07-10 ENCOUNTER — Ambulatory Visit: Admitting: Dermatology

## 2023-07-10 DIAGNOSIS — E1169 Type 2 diabetes mellitus with other specified complication: Secondary | ICD-10-CM | POA: Diagnosis not present

## 2023-07-10 DIAGNOSIS — I10 Essential (primary) hypertension: Secondary | ICD-10-CM | POA: Diagnosis not present

## 2023-07-15 DIAGNOSIS — Z01419 Encounter for gynecological examination (general) (routine) without abnormal findings: Secondary | ICD-10-CM | POA: Diagnosis not present

## 2023-08-27 ENCOUNTER — Encounter: Payer: Self-pay | Admitting: Dermatology

## 2023-08-27 ENCOUNTER — Ambulatory Visit: Admitting: Dermatology

## 2023-08-27 VITALS — BP 166/86

## 2023-08-27 DIAGNOSIS — L219 Seborrheic dermatitis, unspecified: Secondary | ICD-10-CM | POA: Diagnosis not present

## 2023-08-27 DIAGNOSIS — L6681 Central centrifugal cicatricial alopecia: Secondary | ICD-10-CM | POA: Diagnosis not present

## 2023-08-27 DIAGNOSIS — L639 Alopecia areata, unspecified: Secondary | ICD-10-CM | POA: Diagnosis not present

## 2023-08-27 MED ORDER — ZORYVE 0.3 % EX FOAM: 1.0000 | Freq: Every morning | CUTANEOUS | 2 refills | Status: AC

## 2023-08-27 MED ORDER — SAFETY SEAL MISCELLANEOUS MISC: 1.0000 | Freq: Two times a day (BID) | 2 refills | Status: AC

## 2023-08-27 NOTE — Patient Instructions (Addendum)
 Date: Tue Aug 27 2023  Hello Gloria Hudson,  Thank you for visiting today. Here is a summary of the key instructions:  - Medications:   - Continue taking doxycycline  for 6 more months   - Keep using the cream from [facility] once a day in the morning, ongoing  - Zoryve  foam:   - Apply once a day for 1-2 months   - After that, use 2-3 times a week   - Shake the bottle well before use   - Squeeze foam into the cup and apply with finger to scalp  - Pharmacy:   - Zoryve  prescription will be sent to [facility]   - [facility] will contact you about insurance and copay   - Do not fill this prescription at [facility] due to high cost  - Follow-up: Return for a follow-up appointment in 4 months  Please reach out if you have any questions or concerns.  Warm regards,  Dr. Delon Lenis, Dermatology   Your prescription was sent to [facility] in [place]. A representative from [facility] will contact you within 3 business hours to verify your address and insurance information to schedule a free delivery. If for any reason you do not receive a phone call from them, please reach out to them. Their phone number is [phone] and their hours are Monday-Friday 9:00 am-5:00 pm.    Important Information  Due to recent changes in healthcare laws, you may see results of your pathology and/or laboratory studies on MyChart before the doctors have had a chance to review them. We understand that in some cases there may be results that are confusing or concerning to you. Please understand that not all results are received at the same time and often the doctors may need to interpret multiple results in order to provide you with the best plan of care or course of treatment. Therefore, we ask that you please give us  2 business days to thoroughly review all your results before contacting the office for clarification. Should we see a critical lab result, you will be contacted sooner.   If You  Need Anything After Your Visit  If you have any questions or concerns for your doctor, please call our main line at [phone] If no one answers, please leave a voicemail as directed and we will return your call as soon as possible. Messages left after 4 pm will be answered the following business day.   You may also send us  a message via MyChart. We typically respond to MyChart messages within 1-2 business days.  For prescription refills, please ask your pharmacy to contact our office. Our fax number is [phone].  If you have an urgent issue when the clinic is closed that cannot wait until the next business day, you can page your doctor at the number below.    Please note that while we do our best to be available for urgent issues outside of office hours, we are not available 24/7.   If you have an urgent issue and are unable to reach us , you may choose to seek medical care at your doctor's office, retail clinic, urgent care center, or emergency room.  If you have a medical emergency, please immediately call 911 or go to the emergency department. In the event of inclement weather, please call our main line at [phone] for an update on the status of any delays or closures.  Dermatology Medication Tips: Please keep the boxes that topical medications come in  in order to help keep track of the instructions about where and how to use these. Pharmacies typically print the medication instructions only on the boxes and not directly on the medication tubes.   If your medication is too expensive, please contact our office at [phone] or send us  a message through MyChart.   We are unable to tell what your co-pay for medications will be in advance as this is different depending on your insurance coverage. However, we may be able to find a substitute medication at lower cost or fill out paperwork to get insurance to cover a needed medication.   If a prior authorization is required to get  your medication covered by your insurance company, please allow us  1-2 business days to complete this process.  Drug prices often vary depending on where the prescription is filled and some pharmacies may offer cheaper prices.  The website www.goodrx.com contains coupons for medications through different pharmacies. The prices here do not account for what the cost may be with help from insurance (it may be cheaper with your insurance), but the website can give you the price if you did not use any insurance.  - You can print the associated coupon and take it with your prescription to the pharmacy.  - You may also stop by our office during regular business hours and pick up a GoodRx coupon card.  - If you need your prescription sent electronically to a different pharmacy, notify our office through [facility] or by phone at [phone]

## 2023-08-27 NOTE — Progress Notes (Signed)
   Follow-Up Visit   Subjective  Gloria Hudson is a 63 y.o. female who presents for FOLLOW UP:  Patient was last evaluated on 03/12/23.   Alopecia Areata w/ CCCA: Prescribed Medrock Minoxidil/Clobetasol & Doxycycline  100mg .  Patient reports sxs are better as stylist has noticed some peach fuzz at the scalp.   The following portions of the chart were reviewed this encounter and updated as appropriate: medications, allergies, medical history  Review of Systems:  No other skin or systemic complaints except as noted in HPI or Assessment and Plan.  Objective  Well appearing patient in no apparent distress; mood and affect are within normal limits.   A focused examination was performed of the following areas: scalp   Relevant exam findings are noted in the Assessment and Plan.              Assessment & Plan   ALOPECIA AREATA w/ CCCA Exam: Improved, Positive regrowth on exam today  Treatment Plan: - Continue Medrock Minoxidil/Clobetasol. Refills sent.  - Continue taking on Doxycycline  100mg    SEBORRHEIC DERMATITIS Exam: Flared, Pink patches with greasy scale at scalp  Seborrheic Dermatitis is a chronic persistent rash characterized by pinkness and scaling most commonly of the mid face but also can occur on the scalp (dandruff), ears; mid chest, mid back and groin.  It tends to be exacerbated by stress and cooler weather.  People who have neurologic disease may experience new onset or exacerbation of existing seborrheic dermatitis.  The condition is not curable but treatable and can be controlled.  Treatment Plan: - Rx Zorvye foam - apply to scalp every morning.  Long term medication management.  Patient is using long term (months to years) prescription medication  to control their dermatologic condition.  These medications require periodic monitoring to evaluate for efficacy and side effects and may require periodic laboratory monitoring.     No follow-ups on  file.   Documentation: I have reviewed the above documentation for accuracy and completeness, and I agree with the above.  I, Jentry Mcqueary Maranda, CMA, am acting as scribe for [employer], DO.   Delon Lenis, DO

## 2023-09-06 ENCOUNTER — Other Ambulatory Visit: Payer: Self-pay | Admitting: Obstetrics and Gynecology

## 2023-09-06 DIAGNOSIS — Z1231 Encounter for screening mammogram for malignant neoplasm of breast: Secondary | ICD-10-CM

## 2023-10-03 ENCOUNTER — Other Ambulatory Visit: Payer: Self-pay | Admitting: Family Medicine

## 2023-10-03 ENCOUNTER — Ambulatory Visit
Admission: RE | Admit: 2023-10-03 | Discharge: 2023-10-03 | Disposition: A | Discharge status: Home / Self Care | Source: Ambulatory Visit | Attending: Obstetrics and Gynecology | Admitting: Obstetrics and Gynecology

## 2023-10-03 DIAGNOSIS — Z1231 Encounter for screening mammogram for malignant neoplasm of breast: Secondary | ICD-10-CM

## 2023-11-04 DIAGNOSIS — I10 Essential (primary) hypertension: Secondary | ICD-10-CM | POA: Diagnosis not present

## 2023-11-04 DIAGNOSIS — E1169 Type 2 diabetes mellitus with other specified complication: Secondary | ICD-10-CM | POA: Diagnosis not present

## 2023-11-06 ENCOUNTER — Encounter: Payer: Self-pay | Admitting: Pediatrics

## 2023-12-03 ENCOUNTER — Other Ambulatory Visit: Payer: Self-pay | Admitting: Dermatology

## 2023-12-03 ENCOUNTER — Ambulatory Visit (AMBULATORY_SURGERY_CENTER)

## 2023-12-03 VITALS — Ht 64.5 in | Wt 187.0 lb

## 2023-12-03 DIAGNOSIS — Z1211 Encounter for screening for malignant neoplasm of colon: Secondary | ICD-10-CM

## 2023-12-03 DIAGNOSIS — L639 Alopecia areata, unspecified: Secondary | ICD-10-CM

## 2023-12-03 DIAGNOSIS — L6681 Central centrifugal cicatricial alopecia: Secondary | ICD-10-CM

## 2023-12-03 MED ORDER — NA SULFATE-K SULFATE-MG SULF 17.5-3.13-1.6 GM/177ML PO SOLN: 1.0000 | Freq: Once | ORAL | 0 refills | Status: AC

## 2023-12-03 NOTE — Progress Notes (Signed)

## 2023-12-06 ENCOUNTER — Encounter: Payer: Self-pay | Admitting: Pediatrics

## 2023-12-15 NOTE — Progress Notes (Unsigned)
 [facility] History and Physical   Primary Care Physician:  Larnell Hamilton, MD   Reason for Procedure:  Colorectal cancer screening  Plan:    Colonoscopy     HPI: Gloria Hudson is a 63 y.o. female undergoing colonoscopy for colorectal cancer screening.  Patient's last colonoscopy was performed in 2015 and was normal.  No family history of colorectal cancer or polyps.  Patient denies current symptoms of rectal bleeding or change in bowel habits at the time of today's exam.   Past Medical History:  Diagnosis Date   Anemia    Diabetes mellitus without complication (HCC)    GERD (gastroesophageal reflux disease)    Hyperlipidemia    Hypertension    Vitamin D  deficiency     Past Surgical History:  Procedure Laterality Date   BREAST BIOPSY Right 04/03/2017   pash   HAMMER TOE SURGERY Left 02/13/2007   3rd, 4th, and 5th toes    Prior to Admission medications   Medication Sig Start Date End Date Taking? Authorizing Provider  aspirin EC 81 MG tablet Take 81 mg by mouth daily. Swallow whole.    [provider]  B Complex-C (SUPER B COMPLEX PO) Take by mouth.    [provider]  Biotin w/ Vitamins C & E (HAIR/SKIN/NAILS PO) Take by mouth.    [provider]  Cholecalciferol (VITAMIN D ) 2000 UNITS tablet Take 2,000 Units by mouth 4 (four) times daily.    [provider]  doxycycline  (VIBRA -TABS) 100 MG tablet TAKE 1 TABLET BY MOUTH TWICE A DAY 12/04/23   Alm Delon SAILOR, DO  Ferrous Sulfate (SLOW FE PO) Take by mouth daily.    [provider]  hydrochlorothiazide (HYDRODIURIL) 25 MG tablet Take one tablet by mouth every morning 04/02/13   Smith, Melissa, PA-C  Magnesium 250 MG TABS Take by mouth daily.    [provider]  Multiple Vitamins-Minerals (MULTIVITAMIN WITH MINERALS) tablet Take 1 tablet by mouth daily.    [provider]  nortriptyline (PAMELOR) 25 MG capsule Take 25 mg by mouth at bedtime.  11/08/23   [provider]  omeprazole (PRILOSEC) 40 MG capsule Take one capsule by mouth   nightly at bedtime for acid reflux    Tonita Fallow, MD  [facility], 1 MG/DOSE, 4 MG/3ML [facility]  12/02/23   [provider]  Roflumilast  (ZORYVE ) 0.3 % FOAM Apply 1 Application topically in the morning. 08/27/23   Alm Delon SAILOR, DO  rosuvastatin (CRESTOR) 40 MG tablet Take 40 mg by mouth daily. 11/28/23   [provider]  Safety Seal Miscellaneous MISC 1 Application by Does not apply route 2 (two) times daily. AA Gel with Minoxidil USP 8% and Clobetasol USP 0.05% 08/27/23   Alm Delon SAILOR, DO    Current Outpatient Medications  Medication Sig Dispense Refill   aspirin EC 81 MG tablet Take 81 mg by mouth daily. Swallow whole.     B Complex-C (SUPER B COMPLEX PO) Take by mouth.     Biotin w/ Vitamins C & E (HAIR/SKIN/NAILS PO) Take by mouth.     Cholecalciferol (VITAMIN D ) 2000 UNITS tablet Take 2,000 Units by mouth 4 (four) times daily.     doxycycline  (VIBRA -TABS) 100 MG tablet TAKE 1 TABLET BY MOUTH TWICE A DAY 60 tablet 2   Ferrous Sulfate (SLOW FE PO) Take by mouth daily.     hydrochlorothiazide (HYDRODIURIL) 25 MG tablet Take one tablet by mouth every morning 90 tablet 1  Magnesium 250 MG TABS Take by mouth daily.     Multiple Vitamins-Minerals (MULTIVITAMIN WITH MINERALS) tablet Take 1 tablet by mouth daily.     nortriptyline (PAMELOR) 25 MG capsule Take 25 mg by mouth at bedtime.     omeprazole (PRILOSEC) 40 MG capsule Take one capsule by mouth   nightly at bedtime for acid reflux 30 capsule 3   OZEMPIC, 1 MG/DOSE, 4 MG/3ML SOPN      Roflumilast  (ZORYVE ) 0.3 % FOAM Apply 1 Application topically in the morning. 60 g 2   rosuvastatin (CRESTOR) 40 MG tablet Take 40 mg by mouth daily.     Safety Seal Miscellaneous MISC 1 Application by Does not apply route 2 (two) times daily. AA Gel with Minoxidil USP 8% and Clobetasol USP 0.05% 30 g 2   No current  facility-administered medications for this visit.    Allergies as of 12/16/2023   (No Known Allergies)    Family History  Problem Relation Age of Onset   Hypertension Mother    Breast cancer Sister        early 47s   Diabetes Maternal Grandmother    Esophageal cancer Cousin    Colon cancer Neg Hx    Stomach cancer Neg Hx    Rectal cancer Neg Hx    Colon polyps Neg Hx     Social History   Socioeconomic History   Marital status: Divorced    Spouse name: Not on file   Number of children: Not on file   Years of education: Not on file   Highest education level: Not on file  Occupational History   Not on file  Tobacco Use   Smoking status: Never   Smokeless tobacco: Never  Vaping Use   Vaping status: Never Used  Substance and Sexual Activity   Alcohol  use: Yes    Comment: rare   Drug use: No   Sexual activity: Yes    Birth control/protection: Pill  Other Topics Concern   Not on file  Social History Narrative   Not on file   Social Drivers of Health   Financial Resource Strain: Not on file  Food Insecurity: Not on file  Transportation Needs: Not on file  Physical Activity: Not on file  Stress: Not on file  Social Connections: Not on file  Intimate Partner Violence: Not on file    Review of Systems:  All other review of systems negative except as mentioned in the HPI.  Physical Exam: Vital signs LMP 09/27/2013   General:   Alert,  Well-developed, well-nourished, pleasant and cooperative in NAD Airway:  Mallampati  Lungs:  Clear throughout to auscultation.   Heart:  Regular rate and rhythm; no murmurs, clicks, rubs,  or gallops. Abdomen:  Soft, nontender and nondistended. Normal bowel sounds.   Neuro/Psych:  Normal mood and affect. A and O x 3  Gloria Hausen, MD [facility]

## 2023-12-16 ENCOUNTER — Encounter: Payer: Self-pay | Admitting: Pediatrics

## 2023-12-16 ENCOUNTER — Ambulatory Visit (AMBULATORY_SURGERY_CENTER): Admitting: Pediatrics

## 2023-12-16 VITALS — BP 155/84 | HR 83 | Temp 97.4°F | Resp 19 | Ht 64.5 in | Wt 183.0 lb

## 2023-12-16 DIAGNOSIS — Z1211 Encounter for screening for malignant neoplasm of colon: Secondary | ICD-10-CM | POA: Diagnosis not present

## 2023-12-16 DIAGNOSIS — K648 Other hemorrhoids: Secondary | ICD-10-CM

## 2023-12-16 DIAGNOSIS — D123 Benign neoplasm of transverse colon: Secondary | ICD-10-CM

## 2023-12-16 DIAGNOSIS — K5730 Diverticulosis of large intestine without perforation or abscess without bleeding: Secondary | ICD-10-CM | POA: Diagnosis not present

## 2023-12-16 MED ORDER — SODIUM CHLORIDE 0.9 % IV SOLN: 500.0000 mL | INTRAVENOUS | Status: DC

## 2023-12-16 NOTE — Progress Notes (Signed)
 Transferred to PACU via stretcher. Patient arousing to stimulation.  VSS upon leaving procedure room.

## 2023-12-16 NOTE — Patient Instructions (Signed)
    Continue previous diet & medications  Handouts on polyps,diverticulosis,& hemorrhoids given to you today.   Await pathology results on polyps removed   Repeat Colonoscopy in 6-12 months 2 day prep recommended    YOU HAD AN ENDOSCOPIC PROCEDURE TODAY AT THE [facility]:   Refer to the procedure report that was given to you for any specific questions about what was found during the examination.  If the procedure report does not answer your questions, please call your gastroenterologist to clarify.  If you requested that your care partner not be given the details of your procedure findings, then the procedure report has been included in a sealed envelope for you to review at your convenience later.  YOU SHOULD EXPECT: Some feelings of bloating in the abdomen. Passage of more gas than usual.  Walking can help get rid of the air that was put into your GI tract during the procedure and reduce the bloating. If you had a lower endoscopy (such as a colonoscopy or flexible sigmoidoscopy) you may notice spotting of blood in your stool or on the toilet paper. If you underwent a bowel prep for your procedure, you may not have a normal bowel movement for a few days.  Please Note:  You might notice some irritation and congestion in your nose or some drainage.  This is from the oxygen used during your procedure.  There is no need for concern and it should clear up in a day or so.  SYMPTOMS TO REPORT IMMEDIATELY:  Following lower endoscopy (colonoscopy or flexible sigmoidoscopy):  Excessive amounts of blood in the stool  Significant tenderness or worsening of abdominal pains  Swelling of the abdomen that is new, acute  Fever of 100F or higher   For urgent or emergent issues, a gastroenterologist can be reached at any hour by calling [phone]. Do not use MyChart messaging for urgent concerns.    DIET:  We do recommend a small meal at first, but then you may proceed to your regular  diet.  Drink plenty of fluids but you should avoid alcoholic beverages for 24 hours.  ACTIVITY:  You should plan to take it easy for the rest of today and you should NOT DRIVE or use heavy machinery until tomorrow (because of the sedation medicines used during the test).    FOLLOW UP: Our staff will call the number listed on your records the next business day following your procedure.  We will call around 7:15- 8:00 am to check on you and address any questions or concerns that you may have regarding the information given to you following your procedure. If we do not reach you, we will leave a message.     If any biopsies were taken you will be contacted by phone or by letter within the next 1-3 weeks.  Please call us  at [phone] if you have not heard about the biopsies in 3 weeks.    SIGNATURES/CONFIDENTIALITY: You and/or your care partner have signed paperwork which will be entered into your electronic medical record.  These signatures attest to the fact that that the information above on your After Visit Summary has been reviewed and is understood.  Full responsibility of the confidentiality of this discharge information lies with you and/or your care-partner.

## 2023-12-16 NOTE — Progress Notes (Signed)
 Called to room to assist during endoscopic procedure.  Patient ID and intended procedure confirmed with present staff. Received instructions for my participation in the procedure from the performing physician.

## 2023-12-16 NOTE — Op Note (Signed)
 [facility] Patient Name: Gloria Hudson Procedure Date: 12/16/2023 9:31 AM MRN: [id number] Endoscopist: Inocente Hausen , MD, [id number] Age: 63 Referring MD:  Date of Birth: December 29, 1960 Gender: Female Account #: [id number] Procedure:                Colonoscopy Indications:              Screening for colorectal malignant neoplasm, Last                            colonoscopy: 2015 Medicines:                Monitored Anesthesia Care Procedure:                Pre-Anesthesia Assessment:                           - Prior to the procedure, a History and Physical                            was performed, and patient medications and                            allergies were reviewed. The patient's tolerance of                            previous anesthesia was also reviewed. The risks                            and benefits of the procedure and the sedation                            options and risks were discussed with the patient.                            All questions were answered, and informed consent                            was obtained. Prior Anticoagulants: The patient has                            taken no anticoagulant or antiplatelet agents. ASA                            Grade Assessment: II - A patient with mild systemic                            disease. After reviewing the risks and benefits,                            the patient was deemed in satisfactory condition to                            undergo the procedure.  After obtaining informed consent, the colonoscope                            was passed under direct vision. Throughout the                            procedure, the patient's blood pressure, pulse, and                            oxygen saturations were monitored continuously. The                            Olympus Scope SN: [id number] was introduced through                            the anus and advanced to the cecum,  identified by                            appendiceal orifice and ileocecal valve. The                            colonoscopy was performed without difficulty. The                            patient tolerated the procedure well. The quality                            of the bowel preparation was inadequate and 40                            percent obscured. The ileocecal valve, appendiceal                            orifice, and rectum were photographed. Scope In: 9:41:27 AM Scope Out: 10:00:32 AM Scope Withdrawal Time: 0 hours 15 minutes 7 seconds  Total Procedure Duration: 0 hours 19 minutes 5 seconds  Findings:                 The perianal and digital rectal examinations were                            normal. Pertinent negatives include normal                            sphincter tone and no palpable rectal lesions.                           A large amount of semi-liquid stool was found in                            the entire colon, interfering with visualization.                            Lavage of the area was performed using a large  amount of sterile water, resulting in incomplete                            clearance with fair visualization.                           A few small-mouthed diverticula were found in the                            sigmoid colon.                           A 5 mm polyp was found in the transverse colon. The                            polyp was sessile. The polyp was removed with a                            cold snare. Resection and retrieval were complete.                           Internal hemorrhoids were found during retroflexion. Complications:            No immediate complications. Estimated blood loss:                            Minimal. Estimated Blood Loss:     Estimated blood loss was minimal. Impression:               - Preparation of the colon was inadequate.                           - Stool in the entire examined  colon.                           - Diverticulosis in the sigmoid colon.                           - One 5 mm polyp in the transverse colon, removed                            with a cold snare. Resected and retrieved.                           - Internal hemorrhoids. Recommendation:           - Discharge patient to home (ambulatory).                           - Await pathology results.                           - Repeat colonoscopy in 6-12 months because the                            bowel preparation was suboptimal. Recommend a 2-day  bowel prep for next colonoscopy.                           - The findings and recommendations were discussed                            with the patient's family.                           - Patient has a contact number available for                            emergencies. The signs and symptoms of potential                            delayed complications were discussed with the                            patient. Return to normal activities tomorrow.                            Written discharge instructions were provided to the                            patient. Inocente Hausen, MD 12/16/2023 10:05:34 AM This report has been signed electronically.

## 2023-12-16 NOTE — Progress Notes (Signed)
 Pt's states no medical or surgical changes since previsit or office visit.

## 2023-12-17 ENCOUNTER — Telehealth: Payer: Self-pay

## 2023-12-17 NOTE — Telephone Encounter (Signed)
  Follow up Call-     12/16/2023    8:48 AM  Call back number  Post procedure Call Back phone  # [phone]  Permission to leave phone message Yes     Patient questions:  Do you have a fever, pain , or abdominal swelling? No. Pain Score  0 *  Have you tolerated food without any problems? Yes.    Have you been able to return to your normal activities? Yes.    Do you have any questions about your discharge instructions: Diet   No. Medications  No. Follow up visit  No.  Do you have questions or concerns about your Care? No.  Actions: * If pain score is 4 or above: No action needed, pain <4.

## 2023-12-18 ENCOUNTER — Ambulatory Visit: Payer: Self-pay | Admitting: Pediatrics

## 2023-12-18 LAB — SURGICAL PATHOLOGY

## 2024-01-29 DIAGNOSIS — D649 Anemia, unspecified: Secondary | ICD-10-CM | POA: Diagnosis not present

## 2024-02-05 DIAGNOSIS — E669 Obesity, unspecified: Secondary | ICD-10-CM | POA: Diagnosis not present

## 2024-02-05 DIAGNOSIS — Z0000 Encounter for general adult medical examination without abnormal findings: Secondary | ICD-10-CM | POA: Diagnosis not present

## 2024-02-05 DIAGNOSIS — E1169 Type 2 diabetes mellitus with other specified complication: Secondary | ICD-10-CM | POA: Diagnosis not present

## 2024-02-05 DIAGNOSIS — I10 Essential (primary) hypertension: Secondary | ICD-10-CM | POA: Diagnosis not present

## 2024-02-29 ENCOUNTER — Other Ambulatory Visit: Payer: Self-pay | Admitting: Dermatology

## 2024-02-29 DIAGNOSIS — L639 Alopecia areata, unspecified: Secondary | ICD-10-CM

## 2024-02-29 DIAGNOSIS — L6681 Central centrifugal cicatricial alopecia: Secondary | ICD-10-CM

## 2024-03-05 ENCOUNTER — Ambulatory Visit: Admitting: Dermatology

## 2024-03-09 ENCOUNTER — Ambulatory Visit: Admitting: Dermatology

## 2024-05-14 ENCOUNTER — Ambulatory Visit: Admitting: Dermatology
